# Patient Record
Sex: Male | Born: 1990
Health system: Southern US, Community
[De-identification: ages and names within clinical notes are randomized; demographics above are authoritative.]

## PROBLEM LIST (undated history)

## (undated) DIAGNOSIS — F319 Bipolar disorder, unspecified: Secondary | ICD-10-CM

## (undated) DIAGNOSIS — F32A Depression, unspecified: Secondary | ICD-10-CM

## (undated) DIAGNOSIS — F329 Major depressive disorder, single episode, unspecified: Secondary | ICD-10-CM

## (undated) HISTORY — PX: KNEE SURGERY: SHX244

## (undated) HISTORY — PX: KNEE ARTHROSCOPY: SHX127

---
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
## 2010-04-26 ENCOUNTER — Encounter: Payer: Self-pay | Admitting: Orthopedic Surgery

## 2010-06-20 LAB — BASIC METABOLIC PANEL
CO2: 27 mEq/L (ref 19–32)
Calcium: 10 mg/dL (ref 8.4–10.5)
GFR calc Af Amer: >60 mL/min (ref >60)
GFR calc non Af Amer: >60 mL/min (ref >60)
Potassium: 4 mEq/L (ref 3.5–5.1)
Sodium: 139 mEq/L (ref 135–145)

## 2010-06-20 LAB — CBC
HCT: 45.7 % (ref 39.0–52.0)
MCHC: 33.5 g/dL (ref 30.0–36.0)
Platelets: 204 10*3/uL (ref 150–400)
RDW: 12.5 % (ref 11.5–15.5)
WBC: 7.5 10*3/uL (ref 4.0–10.5)

## 2010-06-20 LAB — DIFFERENTIAL
Basophils Absolute: 0.1 10*3/uL (ref 0.0–0.1)
Basophils Relative: 1 % (ref 0–1)
Eosinophils Absolute: 0 10*3/uL (ref 0.0–0.7)
Monocytes Relative: 7 % (ref 3–12)
Neutro Abs: 5.8 10*3/uL (ref 1.7–7.7)
Neutrophils Relative %: 77 % (ref 43–77)

## 2010-06-20 LAB — RAPID URINE DRUG SCREEN, HOSP PERFORMED
Amphetamines: NOT DETECTED
Barbiturates: NOT DETECTED
Benzodiazepines: NOT DETECTED
Cocaine: NOT DETECTED
Opiates: NOT DETECTED

## 2010-06-20 LAB — LITHIUM LEVEL: Lithium Lvl: 0.36 mEq/L — ABNORMAL LOW (ref 0.80–1.40)

## 2010-06-23 LAB — DIFFERENTIAL
Basophils Absolute: 0 10*3/uL (ref 0.0–0.1)
Basophils Relative: 1 % (ref 0–1)
Basophils Relative: 1 % (ref 0–1)
Eosinophils Absolute: 0 10*3/uL (ref 0.0–0.7)
Eosinophils Absolute: 0 10*3/uL (ref 0.0–0.7)
Eosinophils Relative: 0 % (ref 0–5)
Eosinophils Relative: 1 % (ref 0–5)
Lymphocytes Relative: 25 % (ref 12–46)
Lymphs Abs: 1.2 10*3/uL (ref 0.7–4.0)
Lymphs Abs: 1.6 10*3/uL (ref 0.7–4.0)
Monocytes Absolute: 0.4 10*3/uL (ref 0.1–1.0)
Neutro Abs: 4.1 10*3/uL (ref 1.7–7.7)
Neutro Abs: 4.4 10*3/uL (ref 1.7–7.7)
Neutrophils Relative %: 75 % (ref 43–77)
Neutrophils Relative %: 64 % (ref 43–77)

## 2010-06-23 LAB — BASIC METABOLIC PANEL
BUN: 13 mg/dL (ref 6–23)
CO2: 33 mEq/L — ABNORMAL HIGH (ref 19–32)
Calcium: 9.1 mg/dL (ref 8.4–10.5)
Calcium: 9.1 mg/dL (ref 8.4–10.5)
Calcium: 9.4 mg/dL (ref 8.4–10.5)
Chloride: 105 mEq/L (ref 96–112)
Creatinine, Ser: 1.14 mg/dL (ref 0.4–1.5)
Creatinine, Ser: 1.17 mg/dL (ref 0.4–1.5)
GFR calc Af Amer: >60 mL/min (ref >60)
GFR calc Af Amer: >60 mL/min (ref >60)
GFR calc non Af Amer: >60 mL/min (ref >60)
Glucose, Bld: 86 mg/dL (ref 70–99)
Glucose, Bld: 79 mg/dL (ref 70–99)
Sodium: 139 mEq/L (ref 135–145)
Sodium: 140 mEq/L (ref 135–145)

## 2010-06-23 LAB — URINALYSIS, ROUTINE W REFLEX MICROSCOPIC
Glucose, UA: NEGATIVE mg/dL
Leukocytes, UA: NEGATIVE
Protein, ur: NEGATIVE mg/dL
Specific Gravity, Urine: 1.029 (ref 1.005–1.030)
pH: 7 (ref 5.0–8.0)

## 2010-06-23 LAB — CBC
HCT: 45 % (ref 39.0–52.0)
HCT: 46.2 % (ref 39.0–52.0)
Hemoglobin: 15.5 g/dL (ref 13.0–17.0)
Hemoglobin: 14.3 g/dL (ref 13.0–17.0)
Hemoglobin: 15.7 g/dL (ref 13.0–17.0)
MCHC: 34.5 g/dL (ref 30.0–36.0)
MCHC: 34 g/dL (ref 30.0–36.0)
MCV: 88.2 fL (ref 78.0–100.0)
MCV: 88.9 fL (ref 78.0–100.0)
Platelets: 198 10*3/uL (ref 150–400)
RBC: 4.84 MIL/uL (ref 4.22–5.81)
RDW: 13.8 % (ref 11.5–15.5)
RDW: 14.9 % (ref 11.5–15.5)
WBC: 6.3 10*3/uL (ref 4.0–10.5)

## 2010-06-23 LAB — URINE MICROSCOPIC-ADD ON

## 2010-06-23 LAB — COMPREHENSIVE METABOLIC PANEL
ALT: 39 U/L (ref 0–53)
AST: 34 U/L (ref 0–37)
Alkaline Phosphatase: 62 U/L (ref 39–117)
BUN: 7 mg/dL (ref 6–23)
CO2: 27 mEq/L (ref 19–32)
Calcium: 9.8 mg/dL (ref 8.4–10.5)
Creatinine, Ser: 1.09 mg/dL (ref 0.4–1.5)
Glucose, Bld: 93 mg/dL (ref 70–99)
Total Bilirubin: 0.9 mg/dL (ref 0.3–1.2)
Total Protein: 7.1 g/dL (ref 6.0–8.3)

## 2010-06-23 LAB — RAPID URINE DRUG SCREEN, HOSP PERFORMED
Amphetamines: NOT DETECTED
Barbiturates: NOT DETECTED
Barbiturates: NOT DETECTED
Benzodiazepines: NOT DETECTED
Cocaine: NOT DETECTED
Cocaine: NOT DETECTED
Opiates: NOT DETECTED
Tetrahydrocannabinol: NOT DETECTED

## 2010-06-23 LAB — GAMMA GT: GGT: 20 U/L (ref 7–51)

## 2010-06-23 LAB — ETHANOL: Alcohol, Ethyl (B): <5 mg/dL (ref 0–10)

## 2010-06-23 LAB — HEPATIC FUNCTION PANEL
Albumin: 3.7 g/dL (ref 3.5–5.2)
Alkaline Phosphatase: 59 U/L (ref 39–117)
Indirect Bilirubin: 1.1 mg/dL — ABNORMAL HIGH (ref 0.3–0.9)
Total Protein: 6.2 g/dL (ref 6.0–8.3)

## 2010-06-23 LAB — LITHIUM LEVEL
Lithium Lvl: 0.97 mEq/L (ref 0.80–1.40)
Lithium Lvl: <0.25 mEq/L — ABNORMAL LOW (ref 0.80–1.40)

## 2010-06-23 LAB — LIPID PANEL
Cholesterol: 125 mg/dL (ref 0–169)
HDL: 46 mg/dL (ref >34)
Triglycerides: 61 mg/dL (ref <150)

## 2010-07-10 LAB — CBC
Platelets: 160 10*3/uL (ref 150–400)
RDW: 13.2 % (ref 11.4–15.5)

## 2010-07-10 LAB — COMPREHENSIVE METABOLIC PANEL
ALT: 16 U/L (ref 0–53)
Albumin: 4.3 g/dL (ref 3.5–5.2)
Alkaline Phosphatase: 64 U/L (ref 52–171)
Potassium: 4.4 mEq/L (ref 3.5–5.1)
Sodium: 139 mEq/L (ref 135–145)
Total Protein: 6.9 g/dL (ref 6.0–8.3)

## 2010-07-10 LAB — DIFFERENTIAL
Basophils Relative: 1 % (ref 0–1)
Eosinophils Absolute: 0.1 10*3/uL (ref 0.0–1.2)
Monocytes Absolute: 0.5 10*3/uL (ref 0.2–1.2)
Monocytes Relative: 10 % (ref 3–11)

## 2010-08-17 NOTE — Op Note (Signed)
NAMEKHRISTOPHER, [name]                  ACCOUNT NO.:  [id number]   MEDICAL RECORD NO.:  [id number]          PATIENT TYPE:  AMB   LOCATION:  DAY                           FACILITY:  [facility]   PHYSICIAN:  Vickki Hearing, M.D.DATE OF BIRTH:  1990-04-06   DATE OF PROCEDURE:  01/25/2008  DATE OF DISCHARGE:                               OPERATIVE REPORT   PREOPERATIVE DIAGNOSIS:  Torn anterior cruciate ligament, left knee with  patellar dislocation.   POSTOPERATIVE DIAGNOSIS:  Torn anterior cruciate ligament, left knee  with patellar dislocation.   PROCEDURE:  Arthroscopically-assisted anterior cruciate ligament  reconstruction, left knee with Achilles tendon allograft.   SURGEON:  Vickki Hearing, MD   ASSISTED BY:  1. Margaree Mackintosh.  2. Trenton Founds.   ANESTHETIC:  General.   OPERATIVE FINDINGS:  Complete proximal tear of the ACL, medial and  lateral menisci intact, patellar retinaculum healing medially.  Lachman  was grade 2.  Pivot was grade 1.  No specimens.  Minimal blood loss.  Tourniquet was used at 300 mmHg and there were no complications.  Counts  were correct.  The patient went to PACU in good condition.   HISTORY:  A 20 year old male was injured in a football game playing for  [employer].  He was injured on December 14, 2007.  He was  hit directly from the front, hyperextended, and twisted the knee.  His  patellar dislocation was reduced on the field and he was placed in a  brace and scheduled for MRI which eventually showed torn ACL.   The patient was identified in the preop holding area and the left knee  was marked for surgery.  It was countersigned by the surgeon and  antibiotics were instituted.  He was taken to the operating room for  general anesthesia followed by exam under anesthesia.  Exam under  anesthesia findings are noted above.   After sterile prep and drape, the allograft was prepared on the back  table for an 11-mm bone block.   It was placed on a tensioning device.   The knee was injected with 30 mL of Marcaine with epinephrine.  Lateral  portal was established.  Diagnostic arthroscopy was performed.  Medial  portal was established.  Diagnostic arthroscopy was repeated with a  probe to probe the intra-articular structures.   The ACL remnant was removed.  The lateral wall was prepared to view the  over-the-top position.  The over-the-top position was visually confirmed  and palpated with the probe.  The ACL guide was set for 50 degrees and  it was placed into the joint.  The medial tunnel site was marked and the  scope and guide were removed from the joint.   Straight incision was made over the marking on the skin medially  approximately 30-degree off the midline.  Subcutaneous tissue was  divided.  Hemostasis was obtained with electrocautery.  The soft tissue  sleeve was elevated from the medial tibia.  A guidance scope were placed  back in the joint and guide pin was drilled into the joint in  the ACL  stump in its posterior footprint.  This was overreamed with an 11-mm  reamer.   The tunnel was cleaned posteriorly.  Debris was removed from the joint.  The over-the-top guide was placed in a more inferior position as  described by Lewanda Rife.   Guide pin was drilled.  It came out the lateral thigh.  This was  overreamed with an 11-mm reamer to a depth of 30 mm.  The Endobutton  drill guide was drilled over this wire and the tunnel for that measured  45 mm.  The graft was then adjusted for 20-mm plug and a 25-mm  Endobutton was applied to the graft.   The graft was then passed.  The Endobutton was flipped.  Confirmation  was performed with visual inspection through a mark placed on the graft  on the soft tissue and then also by pulling firmly on the distal portion  of the graft.   The knee was cycled several times.  Extension was checked and found to  be full with no graft impingement.   The knee was  placed in extension and an 11 x 35-mm screw was placed.  This was placed on the tibial side.  The knee was then inspected  visually with the scope and found to have good tension on the graft,  good motion.  No debris was left in the joint and it was suctioned out.   The knee was then irrigated.   Pain pump catheter was placed in the joint.  The medial tibial tunnel  site was closed with 0 Monocryl and staples.  The knee was wrapped with  sterile dressings, cryo cuff, and knee brace locked in extension.   The patient extubated and taken to recovery room in stable condition.   The patient is scheduled for discharge on ibuprofen, Vicodin, and  Phenergan.  His followup visit is scheduled for Tuesday, January 29, 2008, at 4:15.      Vickki Hearing, M.D.  Electronically Signed     SEH/MEDQ  D:  01/25/2008  T:  01/26/2008  Job:  [id number]
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
## 2011-01-03 LAB — CBC
MCHC: 34.3
MCV: 87.5
Platelets: 191
RBC: 4.46

## 2011-01-18 ENCOUNTER — Emergency Department (HOSPITAL_COMMUNITY)
Admission: EM | Admit: 2011-01-18 | Discharge: 2011-01-19 | Disposition: A | Discharge status: Home / Self Care | Payer: Medicare Other | Attending: Emergency Medicine | Admitting: Emergency Medicine

## 2011-01-18 DIAGNOSIS — F319 Bipolar disorder, unspecified: Secondary | ICD-10-CM | POA: Insufficient documentation

## 2011-01-18 DIAGNOSIS — M549 Dorsalgia, unspecified: Secondary | ICD-10-CM

## 2011-01-18 HISTORY — DX: Bipolar disorder, unspecified: F31.9
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
## 2011-01-19 MED ORDER — IBUPROFEN 800 MG PO TABS
800.0000 mg | ORAL_TABLET | Freq: Once | ORAL | Status: AC
Start: 2011-01-19 — End: 2011-01-19
  Administered 2011-01-19: 800 mg via ORAL
  Filled 2011-01-19: qty 1

## 2011-01-19 MED ORDER — IBUPROFEN 800 MG PO TABS: 800.0000 mg | ORAL_TABLET | Freq: Three times a day (TID) | ORAL | Status: AC

## 2011-01-27 ENCOUNTER — Ambulatory Visit (HOSPITAL_COMMUNITY)
Admission: RE | Admit: 2011-01-27 | Discharge: 2011-01-27 | Disposition: A | Discharge status: Home / Self Care | Payer: Medicare Other | Attending: Psychiatry | Admitting: Psychiatry

## 2011-01-27 DIAGNOSIS — F309 Manic episode, unspecified: Secondary | ICD-10-CM | POA: Insufficient documentation
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
## 2011-03-01 ENCOUNTER — Emergency Department (HOSPITAL_COMMUNITY)
Admission: EM | Admit: 2011-03-01 | Discharge: 2011-03-01 | Disposition: A | Discharge status: Home / Self Care | Payer: Medicare Other | Attending: Emergency Medicine | Admitting: Emergency Medicine

## 2011-03-01 ENCOUNTER — Encounter (HOSPITAL_COMMUNITY): Payer: Self-pay | Admitting: *Deleted

## 2011-03-01 DIAGNOSIS — F319 Bipolar disorder, unspecified: Secondary | ICD-10-CM | POA: Insufficient documentation
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
## 2011-03-01 NOTE — ED Notes (Signed)
States he feels like hurting someone, denies suicidal ideas

## 2011-03-01 NOTE — BH Assessment (Signed)
Assessment Note   [name] is an 20 y.o. male.   PT REPORTS HE LIVES WITH HIS MOTHER AND GRANDMOTHER AND GRANDMOTHER GETS ON HIS NERVES.  SHE IS ALWAYS ON HIM ABOUT SOMETHING SO HE ISOLATES IN HIS BEDROOM.  HE CAME TO THE HOSPITAL TO GET AWAY FROM HOME FOR A WHILE AND HOPEFULLY TO MEET SOME NEW PEOPLE.  HE DENIES BEING SUICIDAL OR HOMICIDAL AND HE IS NOT PSYCHOTIC.  PT REPORTS HIS MENTAL HEALTH APPOINTMENT IS TOMORROW.  HE IS ADVISED TO KEEP HIS APPOINTMENT AND CONTINUE TO BE COMPLIANT WITH HIS MEDICATIONS. PT AGREES. DISCUSSED  RECOMMENDATIONS WITH DR. Colon Branch WHO AGREES WITH DISPOSITION.         Axis I: Bipolar, Depressed Axis II: Deferred Axis III:  Past Medical History  Diagnosis Date  . Bipolar 1 disorder    Axis IV: problems related to social environment Axis V: 61-70 mild symptoms  Past Medical History:  Past Medical History  Diagnosis Date  . Bipolar 1 disorder     Past Surgical History  Procedure Date  . Knee surgery     Family History: No family history on file.  Social History:  reports that he has never smoked. He does not have any smokeless tobacco history on file. He reports that he does not drink alcohol or use illicit drugs.  Allergies: No Known Allergies  Home Medications:  No current facility-administered medications on file as of 03/01/2011.   No current outpatient prescriptions on file as of 03/01/2011.    OB/GYN Status:  No LMP for male patient.  General Assessment Data Assessment Number: 1  Living Arrangements: Parent Can pt return to current living arrangement?: Yes Admission Status: Voluntary Is patient capable of signing voluntary admission?: Yes Transfer from: Home Referral Source: Self/Family/Friend  Risk to self Suicidal Ideation: No Suicidal Intent: No Is patient at risk for suicide?: No Suicidal Plan?: No Access to Means: No What has been your use of drugs/alcohol within the last 12 months?: NA Other Self Harm Risks:  NA Triggers for Past Attempts: None known Intentional Self Injurious Behavior: None Factors that decrease suicide risk: Absense of psychosis Family Suicide History: No Recent stressful life event(s): Other (Comment) (NONE) Persecutory voices/beliefs?: No Depression: Yes Depression Symptoms: Isolating;Feeling angry/irritable Substance abuse history and/or treatment for substance abuse?: No Suicide prevention information given to non-admitted patients: Not applicable  Risk to Others Homicidal Ideation: No Thoughts of Harm to Others: No Current Homicidal Intent: No Current Homicidal Plan: No Access to Homicidal Means: No Identified Victim: NA History of harm to others?: No Assessment of Violence: None Noted Violent Behavior Description: NA Does patient have access to weapons?: No Criminal Charges Pending?: No Does patient have a court date: No  Mental Status Report Appear/Hygiene: Improved Eye Contact: Good Motor Activity: Freedom of movement Speech: Logical/coherent Level of Consciousness: Alert Mood: Depressed Affect: Appropriate to circumstance Anxiety Level: Minimal Thought Processes: Coherent;Relevant Judgement: Unimpaired Orientation: Person;Place;Time;Situation Obsessive Compulsive Thoughts/Behaviors: None  Cognitive Functioning Concentration: Normal Memory: Recent Intact;Remote Intact IQ: Average Insight: Fair Impulse Control: Good Appetite: Good Sleep: No Change Total Hours of Sleep: 8  Vegetative Symptoms: None  Prior Inpatient/Outpatient Therapy Prior Therapy: Outpatient Prior Therapy Dates: CURRENTLY Prior Therapy Facilty/Provider(s): [facility] Reason for Treatment: BIPOLAR-DEPRESSED            Values / Beliefs Cultural Requests During Hospitalization: None Spiritual Requests During Hospitalization: None        Additional Information 1:1 In Past 12 Months?: No CIRT Risk: No  Elopement Risk: No Does patient have medical clearance?:  No     Disposition:  Disposition Disposition of Patient: Referred to Patient referred to: Other (Comment) ([facility]-APPT TOMORROW)  On Site Evaluation by:   Reviewed with Physician:     Hattie Perch [name] 03/01/2011 9:16 PM

## 2011-03-02 NOTE — ED Provider Notes (Signed)
History     CSN: [id number] Arrival date & time: 03/01/2011  7:51 PM   First MD Initiated Contact with Patient 03/01/11 1957      Chief Complaint  Patient presents with  . Medical Clearance    (Consider location/radiation/quality/duration/timing/severity/associated sxs/prior treatment) HPI Comments: [name] is a 20 y.o. male  Well known to me and the ER with a history of bipolar disorder who presents to the Emergency Department complaining of having thought about hurting someone. He is not specific about any person he wants to hurt. He does not have a plan or method.He denies suicidal ideation. He says he wantsto to go to the hospital so he can meet people. He denies hallucinations. He has not attempted to hurt himself or others.   Past Medical History  Diagnosis Date  . Bipolar 1 disorder     Past Surgical History  Procedure Date  . Knee surgery     No family history on file.  History  Substance Use Topics  . Smoking status: Never Smoker   . Smokeless tobacco: Not on file  . Alcohol Use: No      Review of Systems  Psychiatric/Behavioral:       Thoughts of hurting someone  All other systems reviewed and are negative.    Allergies  Review of patient's allergies indicates no known allergies.  Home Medications   Current Outpatient Rx  Name Route Sig Dispense Refill  . LITHIUM CARBONATE 450 MG PO TBCR Oral Take 450 mg by mouth at bedtime.      Marland Kitchen MIRTAZAPINE 15 MG PO TABS Oral Take 15 mg by mouth at bedtime.      Marland Kitchen PALIPERIDONE 6 MG PO TB24 Oral Take 6 mg by mouth every morning.      . TRAZODONE HCL 100 MG PO TABS Oral Take 100 mg by mouth at bedtime.        BP 130/88  Pulse 104  Temp(Src) 98.9 F (37.2 C) (Oral)  Resp 16  Ht 5\' 7"  (1.702 m)  Wt 196 lb (88.905 kg)  BMI 30.70 kg/m2  SpO2 100%  Physical Exam  Nursing note and vitals reviewed. Constitutional: He is oriented to person, place, and time. He appears well-developed and well-nourished. No  distress.  HENT:  Head: Normocephalic and atraumatic.  Eyes: EOM are normal.  Neck: Normal range of motion.  Cardiovascular: Normal rate, normal heart sounds and intact distal pulses.   Pulmonary/Chest: Effort normal and breath sounds normal.  Abdominal: Soft. Bowel sounds are normal.  Musculoskeletal: Normal range of motion.  Neurological: He is alert and oriented to person, place, and time.  Skin: Skin is warm and dry.  Psychiatric: He has a normal mood and affect.       Patient denies suicidal ideation, is vague about hurting others. He is not psychotic    ED Course  Procedures (including critical care time)   Labs Reviewed  LAB REPORT - SCANNED   No results found.   1. Bipolar disorder    1930 Patient seen and evaluation by Behavioral Health, [facility], Ms. Hyacinth Meeker. She is familiar with the patient.She advises he is not suicidal, homicidal or psychotic. He dos not pose a danger to himself or others.He has a need to be around people. She provided him with several resources .    MDM  Patient with bipolar disorder here with vague c/o of wanting to hurt someone. On closer questioning, determined patient wants to be around more people. He is  not suicidal, homicidal or psychotic. The patient appears reasonably screened and/or stabilized for discharge and I doubt any other medical condition or other [facility] requiring further screening, evaluation, or treatment in the ED at this time prior to discharge.  MDM Reviewed: nursing note and vitals Consults: Behavioral Health.          Nicoletta Dress. Colon Branch, MD 03/02/11 1308

## 2011-03-07 ENCOUNTER — Emergency Department (HOSPITAL_COMMUNITY)
Admission: EM | Admit: 2011-03-07 | Discharge: 2011-03-07 | Disposition: A | Discharge status: Home / Self Care | Payer: Medicare Other | Attending: Emergency Medicine | Admitting: Emergency Medicine

## 2011-03-07 ENCOUNTER — Encounter (HOSPITAL_COMMUNITY): Payer: Self-pay | Admitting: *Deleted

## 2011-03-07 DIAGNOSIS — F319 Bipolar disorder, unspecified: Secondary | ICD-10-CM

## 2011-03-07 LAB — DIFFERENTIAL
Basophils Absolute: 0.1 10*3/uL (ref 0.0–0.1)
Basophils Relative: 1 % (ref 0–1)
Eosinophils Absolute: 0.1 10*3/uL (ref 0.0–0.7)
Eosinophils Relative: 1 % (ref 0–5)
Lymphocytes Relative: 25 % (ref 12–46)
Lymphs Abs: 2.1 K/uL (ref 0.7–4.0)
Monocytes Absolute: 0.9 K/uL (ref 0.1–1.0)
Monocytes Relative: 11 % (ref 3–12)
Neutro Abs: 5.3 K/uL (ref 1.7–7.7)
Neutrophils Relative %: 63 % (ref 43–77)

## 2011-03-07 LAB — RAPID URINE DRUG SCREEN, HOSP PERFORMED
Amphetamines: NOT DETECTED
Barbiturates: NOT DETECTED
Benzodiazepines: NOT DETECTED
Cocaine: NOT DETECTED
Opiates: NOT DETECTED
Tetrahydrocannabinol: NOT DETECTED

## 2011-03-07 LAB — CBC
HCT: 40.2 % (ref 39.0–52.0)
Hemoglobin: 14 g/dL (ref 13.0–17.0)
MCH: 29.8 pg (ref 26.0–34.0)
MCHC: 34.8 g/dL (ref 30.0–36.0)
MCV: 85.5 fL (ref 78.0–100.0)
Platelets: 202 10*3/uL (ref 150–400)
RBC: 4.7 MIL/uL (ref 4.22–5.81)
RDW: 12.3 % (ref 11.5–15.5)
WBC: 8.5 10*3/uL (ref 4.0–10.5)

## 2011-03-07 LAB — URINALYSIS, ROUTINE W REFLEX MICROSCOPIC
Bilirubin Urine: NEGATIVE
Glucose, UA: NEGATIVE mg/dL
Hgb urine dipstick: NEGATIVE
Leukocytes, UA: NEGATIVE
Nitrite: NEGATIVE
Protein, ur: NEGATIVE mg/dL
Specific Gravity, Urine: >1.03 — ABNORMAL HIGH (ref 1.005–1.030)
Urobilinogen, UA: 0.2 mg/dL (ref 0.0–1.0)
pH: 6 (ref 5.0–8.0)

## 2011-03-07 LAB — BASIC METABOLIC PANEL
CO2: 27 mEq/L (ref 19–32)
Calcium: 9.3 mg/dL (ref 8.4–10.5)
GFR calc Af Amer: >90 mL/min (ref >90)
GFR calc non Af Amer: 89 mL/min — ABNORMAL LOW (ref >90)
Sodium: 140 mEq/L (ref 135–145)

## 2011-03-07 LAB — BASIC METABOLIC PANEL WITH GFR
BUN: 14 mg/dL (ref 6–23)
Chloride: 106 meq/L (ref 96–112)
Creatinine, Ser: 1.17 mg/dL (ref 0.50–1.35)
Glucose, Bld: 95 mg/dL (ref 70–99)
Potassium: 3.8 meq/L (ref 3.5–5.1)

## 2011-03-07 LAB — ETHANOL: Alcohol, Ethyl (B): <11 mg/dL (ref 0–11)

## 2011-03-07 NOTE — ED Provider Notes (Signed)
History   This chart was scribed for [name]. Grant Lamas, MD by Sofie Rower. The patient was seen in room APA16A/APA16A and the patient's care was started at 6:21 PM.  CSN: [id number] Arrival date & time: 03/07/2011  5:31 PM   First MD Initiated Contact with Patient 03/07/11 1759      Chief Complaint  Patient presents with  . Medical Clearance    (Consider location/radiation/quality/duration/timing/severity/associated sxs/prior treatment) HPI [name] is a 20 y.o. male who presents to the Emergency Department for medical clearance due to depression following evaluation at [facility] facility this afternoon. Patient accompanied with IVC paperwork filed by [facility]. Per patient, he was being evaluated by counselor at [facility] today and had mentioned he was having a bad day and feeling "down" when the counselor filed IVC paperwork to have him evaluated. Notes he has had thoughts of hurting others due to his anger and depression but expresses no plan to do so. Reports previous history of similar thoughts and states thoughts are generally relieved with physical activity/exertion or lashing out at inanimate objects. Also reports having suicidal thoughts today but reports having no plan and currently denies SI. Patient was evaluated in ED on March 01, 2011 (1 week ago) for similar complaint. Patient with h/o Bipolar 1 disorder.  Past Medical History  Diagnosis Date  . Bipolar 1 disorder     Past Surgical History  Procedure Date  . Knee surgery     No family history on file.  History  Substance Use Topics  . Smoking status: Never Smoker   . Smokeless tobacco: Not on file  . Alcohol Use: No      Review of Systems 10 Systems reviewed and are negative for acute change except as noted in the HPI.  Allergies  Review of patient's allergies indicates no known allergies.  Home Medications   Current Outpatient Rx  Name Route Sig Dispense Refill  . LITHIUM CARBONATE ER 450 MG PO TBCR Oral  Take 900 mg by mouth at bedtime.     Marland Kitchen MIRTAZAPINE 15 MG PO TABS Oral Take 15 mg by mouth at bedtime.      Marland Kitchen PALIPERIDONE 6 MG PO TB24 Oral Take 6 mg by mouth every morning.      . TRAZODONE HCL 100 MG PO TABS Oral Take 100 mg by mouth at bedtime.        BP 140/88  Pulse 74  Temp(Src) 98.8 F (37.1 C) (Oral)  Resp 20  Ht 5\' 7"  (1.702 m)  Wt 196 lb (88.905 kg)  BMI 30.70 kg/m2  SpO2 99%  Physical Exam  Nursing note and vitals reviewed. Constitutional: He is oriented to person, place, and time. He appears well-developed and well-nourished. No distress.  HENT:  Head: Normocephalic and atraumatic.  Eyes: EOM are normal. Pupils are equal, round, and reactive to light. No scleral icterus.  Neck: Normal range of motion. Neck supple. No tracheal deviation present.  Cardiovascular: Normal rate.   Pulmonary/Chest: Effort normal and breath sounds normal. No respiratory distress. He has no wheezes.  Abdominal: He exhibits no distension.  Musculoskeletal: Normal range of motion. He exhibits no edema.  Neurological: He is alert and oriented to person, place, and time. No sensory deficit.  Skin: Skin is warm and dry.  Psychiatric: His behavior is normal. He is not actively hallucinating. Thought content is not delusional. He exhibits a depressed mood. He expresses no suicidal ideation. He expresses no suicidal plans and no homicidal plans.  Pt. Admits to thoughts of harming himself and other people but is non-specific. Pt. Does not have a plan.    ED Course  Procedures (including critical care time)  DIAGNOSTIC STUDIES: Oxygen Saturation is 99% on room air, normal by my interpretation.    COORDINATION OF CARE: 6:26PM- EDP at bedside consults with pt about his recent desires to hurt himself and others.  consult  Labs Reviewed  BASIC METABOLIC PANEL - Abnormal; Notable for the following:    GFR calc non Af Amer 89 (*)    All other components within normal limits  URINALYSIS,  ROUTINE W REFLEX MICROSCOPIC - Abnormal; Notable for the following:    Specific Gravity, Urine >1.030 (*)    Ketones, ur TRACE (*)    All other components within normal limits  CBC  DIFFERENTIAL  URINE RAPID DRUG SCREEN (HOSP PERFORMED)  ETHANOL   No results found.   No diagnosis found.    MDM    My exam and history is not very concerning for acute psychosis or SI or HI.  He admits he has more anger issues.  Ella with [facility] who is familiar with pt, in fact saw and facilitated discharge last week from here, reports pt is about at his baseline, he denies current SI or HI, hallucinations.  He goes to [facility] so has close follow up.  He understands to return if he feels worse.  I did review his prior ED charts.  Pt has no h/o prior suicide attempts.       I personally performed the services described in this documentation, which was scribed in my presence. The recorded information has been reviewed and considered.       [name]. Grant Lamas, MD 03/07/11 2127

## 2011-03-07 NOTE — ED Notes (Signed)
Pt states he went to the doctor today and the next thing he knew he was coming here. Staff from [name] brought med list and stated the doctor did not finish his dictation to fax [place] the pt assessment. Pt calm and cooperative. Sleepy.

## 2011-03-07 NOTE — BH Assessment (Signed)
Assessment Note   [name] is an 20 y.o. male. -PT REPORTS HE WENT TO HIS SCHEDULED APPOINTMENT WITH [facility] TODAY AND EXPLAINED HE FELT LIKE HIS MEDS WERE NOT RIGHT FOR HIM AS HE FELT SLEEPY AND CONCENTRATED ON BAD THINGS THAT HAS HAPPENED IN HIS LIFE. HE ALSO EXPRESSED SOMETIMES HE FEELS LIKE HE MAY WANT TO HURT HIMSELF AND OTHERS BUT DOES NOT ACT ON THOSE THOUGHTS AND DENIES FEELING SUICIDAL OR HOMICIDAL.  PT WAS IN THE ED 1 WEEK AGO STATING HE WANTED TO GO SOMEWHERE TO GET AWAY FROM HIS GRANDMOTHER AND MEET NEW PEOPLE. PT REPORTS NO TRANSPORTATION AND BEING BORED AT HOME AND OFTEN ISOLATES HIMSELF IN HIS ROOM. PT CONTRACTS FOR SAFETY AND TO CONTINUE TO REPORT TO [facility] WHEN SCHEDULED. DISCUSS WITH DR Oletta Lamas  WHO AGREES WITH DISPOSITION AND RESCINDED THE PETITION.        Axis I: Bipolar, Depressed Axis II: Deferred Axis III:  Past Medical History  Diagnosis Date  . Bipolar 1 disorder    Axis IV: problems with primary support group Axis V: 51-60 moderate symptoms  Past Medical History:  Past Medical History  Diagnosis Date  . Bipolar 1 disorder     Past Surgical History  Procedure Date  . Knee surgery     Family History: No family history on file.  Social History:  reports that he has never smoked. He does not have any smokeless tobacco history on file. He reports that he does not drink alcohol or use illicit drugs.  Allergies: No Known Allergies  Home Medications:  Medications Prior to Admission  Medication Sig Dispense Refill  . lithium carbonate (ESKALITH) 450 MG CR tablet Take 900 mg by mouth at bedtime.       . mirtazapine (REMERON) 15 MG tablet Take 15 mg by mouth at bedtime.        . paliperidone (INVEGA) 6 MG 24 hr tablet Take 6 mg by mouth every morning.        . traZODone (DESYREL) 100 MG tablet Take 100 mg by mouth at bedtime.         No current facility-administered medications on file as of 03/07/2011.    OB/GYN Status:  No LMP for male  patient.  General Assessment Data Assessment Number: 2  Living Arrangements: Parent Can pt return to current living arrangement?: Yes Admission Status: Involuntary Is patient capable of signing voluntary admission?: No Transfer from: Other (Comment) Referral Source: Other ([facility])  Risk to self Suicidal Ideation: No Suicidal Intent: No Is patient at risk for suicide?: No Suicidal Plan?: No Access to Means: No What has been your use of drugs/alcohol within the last 12 months?: NA Other Self Harm Risks: NA Triggers for Past Attempts: None known Intentional Self Injurious Behavior: None Factors that decrease suicide risk: Positive therapeutic relationships Family Suicide History: No Recent stressful life event(s): Conflict (Comment) (FAMILY CONFLICT) Persecutory voices/beliefs?: No Depression: Yes Depression Symptoms: Loss of interest in usual pleasures;Isolating;Feeling angry/irritable Substance abuse history and/or treatment for substance abuse?: No Suicide prevention information given to non-admitted patients: Yes  Risk to Others Homicidal Ideation: No Thoughts of Harm to Others: No Current Homicidal Intent: No Current Homicidal Plan: No Access to Homicidal Means: No Identified Victim: NA History of harm to others?: No Assessment of Violence: None Noted Violent Behavior Description: NA Does patient have access to weapons?: No Criminal Charges Pending?: No Does patient have a court date: No  Mental Status Report Appear/Hygiene: Improved Eye Contact: Good Motor Activity:  Freedom of movement Speech: Logical/coherent Level of Consciousness: Alert Mood: Irritable;Depressed Affect: Depressed Anxiety Level: Minimal Thought Processes: Coherent;Relevant Judgement: Unimpaired Orientation: Person;Place;Time;Situation Obsessive Compulsive Thoughts/Behaviors: None  Cognitive Functioning Concentration: Normal Memory: Recent Intact;Remote Intact IQ: Average Insight:  Fair Impulse Control: Good Appetite: Good Sleep: No Change Total Hours of Sleep: 8  Vegetative Symptoms: None  Prior Inpatient/Outpatient Therapy Prior Therapy: Outpatient Prior Therapy Dates: CURRENTLY Prior Therapy Facilty/Provider(s): [facility] Reason for Treatment: BIPOLAR-DEPRESSED            Values / Beliefs Cultural Requests During Hospitalization: None Spiritual Requests During Hospitalization: None        Additional Information 1:1 In Past 12 Months?: No CIRT Risk: No Elopement Risk: No Does patient have medical clearance?: Yes     Disposition:  Disposition Disposition of Patient: Referred to [facility]) Patient referred to: Other (Comment) [facility])  On Site Evaluation by:   Reviewed with Physician:     Hattie Perch [name] 03/07/2011 10:20 PM

## 2011-03-07 NOTE — Discharge Instructions (Signed)
Manic Depression (Bipolar Disorder) Bipolar disorder is also known as manic depressive illness. It is when the brain does not function properly and causes shifts in a person's moods, energy and ability to function in everyday life. These shifts are different from the normal ups and downs that everyone experiences. Instead the shifts are severe. If this goes untreated, the person's life becomes more and more disorderly. People with this disorder can be treated can lead full and productive lives. This disorder must be managed throughout life.  SYMPTOMS   Bipolar disorder causes dramatic mood swings. These mood swings go in cycles. They cycle from extreme "highs" and irritable to deep "lows" of sadness and hopelessness.   Between the extreme moods, there are usually periods of normal mood.   Along with the mood shifts, the person will have severe changes in energy and behavior. The periods of "highs" and "lows" are called episodes of mania and depression.  Signs of mania:  Lots of energy, activity and restlessness.   Extreme "high" or good mood.   Extreme irritability.   Racing thoughts and talking very fast.   Jumping from one idea to another.   Not able to focus, easily distracted.   Little need to sleep.   Grand beliefs in one's abilities and powers.   Spending sprees.   Increased sexual drive. This can result in many sexual partners.   Poor judgment.   Abuse of drugs, particularly cocaine, alcohol, and sleeping medication.   Aggressive or provocative behavior.   A lasting period of behavior that is different from usual.   Denial that anything is wrong.  *A manic episode is identified if a "high" mood happens with three or more of the other symptoms lasting most of the day, nearly everyday for a week or longer. If the mood is more irritable in nature, four additional symptoms must be present. Signs of depression:  Lasting feelings of sadness, anxiety, or empty mood.    Feelings of hopelessness with negative thoughts.   Feelings of guilt, worthlessness, or helplessness.   Loss of interest or pleasure in activities once enjoyed, including sex.   Feelings of fatigue or having less energy.   Trouble focusing, making decisions, remembering.   Feeling restless or irritable.   Sleeping too little or too much.   Change in eating with possible weight gain or loss.   Feeling ongoing pain that is not caused by physical illness or injury.   Thoughts of death or suicide or suicide attempts.  *A depressive episode is identified as having five or more of the above symptoms that last most of the day, nearly everyday for two weeks or longer. CAUSES   Research shows that there is no single cause for the disorder. Many factors act together to produce the illness.   This can be passed down from family (hereditary).   Environment may play a part.  TREATMENT   Long-term treatment is strongly recommended because bipolar disorder is a repeated illness. This disorder is better controlled if treatment is ongoing than if it is off and on.   A combination of medication and talk therapy is best for managing the disorder over time.   Medication.   Medication can be prescribed by a doctor that is an expert in treating mental disorders (psychiatrists). Medications known as "mood stabilizers" are usually prescribed to help control the illness. Other medications can be added when needed. These medicines usually treat episodes of mania or depression that break through despite the   mood stabilizer.   Talk Therapy.   Along with medication, some forms of talk therapy are helpful in providing support, education and guidance to people with the illness and their families. Studies show that this type of treatment increases mood stability, decreases need for hospitalization and improves how they function society.   Electroconvulsive Therapy (ECT).   In extreme situations where  the above treatments do not work or work too slowly to relieve severe symptoms, ECT may be considered.  Document Released: 06/27/2000 Document Revised: 12/01/2010 Document Reviewed: 02/16/2007 [employer] Patient Information 2012 [employer]. 

## 2011-03-07 NOTE — ED Notes (Signed)
Pt sent by [employer] by [facility] for commitment. Pt states he told them he wanted to hurt himself due to medications. Pt currently denies SI at this time. [facility] to fax over papers per officer.

## 2011-03-16 ENCOUNTER — Encounter (HOSPITAL_COMMUNITY): Payer: Self-pay | Admitting: *Deleted

## 2011-03-16 ENCOUNTER — Emergency Department (HOSPITAL_COMMUNITY)
Admission: EM | Admit: 2011-03-16 | Discharge: 2011-03-21 | Disposition: A | Discharge status: Other Acute Inpatient Hospital | Payer: Medicare Other | Attending: Emergency Medicine | Admitting: Emergency Medicine

## 2011-03-16 DIAGNOSIS — Z87891 Personal history of nicotine dependence: Secondary | ICD-10-CM | POA: Insufficient documentation

## 2011-03-16 DIAGNOSIS — F319 Bipolar disorder, unspecified: Secondary | ICD-10-CM

## 2011-03-16 LAB — BASIC METABOLIC PANEL
BUN: 8 mg/dL (ref 6–23)
Chloride: 102 mEq/L (ref 96–112)
GFR calc Af Amer: >90 mL/min (ref >90)
Glucose, Bld: 98 mg/dL (ref 70–99)
Potassium: 4.2 mEq/L (ref 3.5–5.1)

## 2011-03-16 LAB — RAPID URINE DRUG SCREEN, HOSP PERFORMED: Barbiturates: NOT DETECTED

## 2011-03-16 LAB — CBC
HCT: 43.8 % (ref 39.0–52.0)
Hemoglobin: 14.2 g/dL (ref 13.0–17.0)
RDW: 12.8 % (ref 11.5–15.5)
WBC: 5.1 10*3/uL (ref 4.0–10.5)

## 2011-03-16 MED ORDER — LORAZEPAM 1 MG PO TABS
1.0000 mg | ORAL_TABLET | ORAL | Status: DC | PRN
  Administered 2011-03-17 – 2011-03-20 (×4): 1 mg via ORAL
  Filled 2011-03-16 (×2): qty 1

## 2011-03-16 MED ORDER — ACETAMINOPHEN 325 MG PO TABS: 650.0000 mg | ORAL_TABLET | ORAL | Status: DC | PRN

## 2011-03-16 MED ORDER — MIRTAZAPINE 15 MG PO TABS
15.0000 mg | ORAL_TABLET | Freq: Every day | ORAL | Status: DC
  Administered 2011-03-17 – 2011-03-20 (×4): 15 mg via ORAL
  Filled 2011-03-16 (×3): qty 1

## 2011-03-16 MED ORDER — ONDANSETRON HCL 4 MG PO TABS: 4.0000 mg | ORAL_TABLET | Freq: Three times a day (TID) | ORAL | Status: DC | PRN

## 2011-03-16 MED ORDER — LORAZEPAM 2 MG/ML IJ SOLN
2.0000 mg | Freq: Once | INTRAMUSCULAR | Status: AC
  Administered 2011-03-16: 2 mg via INTRAVENOUS

## 2011-03-16 MED ORDER — TRAZODONE HCL 50 MG PO TABS
100.0000 mg | ORAL_TABLET | Freq: Every day | ORAL | Status: DC
  Administered 2011-03-17 – 2011-03-20 (×4): 100 mg via ORAL
  Filled 2011-03-16 (×3): qty 2

## 2011-03-16 MED ORDER — HALOPERIDOL LACTATE 5 MG/ML IJ SOLN
5.0000 mg | Freq: Once | INTRAMUSCULAR | Status: AC
  Administered 2011-03-16: 5 mg via INTRAMUSCULAR

## 2011-03-16 MED ORDER — IBUPROFEN 400 MG PO TABS: 600.0000 mg | ORAL_TABLET | Freq: Three times a day (TID) | ORAL | Status: DC | PRN

## 2011-03-16 MED ORDER — LORAZEPAM 2 MG/ML IJ SOLN
INTRAMUSCULAR | Status: AC
  Filled 2011-03-16: qty 1

## 2011-03-16 MED ORDER — HALOPERIDOL LACTATE 5 MG/ML IJ SOLN
INTRAMUSCULAR | Status: AC
  Filled 2011-03-16: qty 1

## 2011-03-16 MED ORDER — PALIPERIDONE ER 6 MG PO TB24
6.0000 mg | ORAL_TABLET | ORAL | Status: DC
Start: 2011-03-17 — End: 2011-03-21
  Administered 2011-03-17 – 2011-03-20 (×4): 6 mg via ORAL
  Filled 2011-03-16 (×4): qty 1

## 2011-03-16 MED ORDER — LITHIUM CARBONATE ER 450 MG PO TBCR
900.0000 mg | EXTENDED_RELEASE_TABLET | Freq: Every day | ORAL | Status: DC
  Administered 2011-03-17 – 2011-03-20 (×4): 900 mg via ORAL
  Filled 2011-03-16 (×3): qty 2

## 2011-03-16 NOTE — ED Provider Notes (Addendum)
This chart was scribed for Joya Gaskins, MD by Wallis Mart. The patient was seen in room APA03/APA03 and the patient's care was started at 12:38 PM.   CSN: [id number] Arrival date & time: 03/16/2011 11:05 AM   First MD Initiated Contact with Patient 03/16/11 1147      Chief Complaint  Patient presents with  . Medical Clearance     The history is provided by the patient. The history is limited by the condition of the patient.   Level 5 caveat due to altered mental status.    [name] is a 20 y.o. male who presents to the Emergency Department due to altered mental status. Per [facility], pt is not taking his meds,hasn't slept in 3 days, has been physically and verbally aggressive both physically and verbally, and is having delusions. Pt has hx of Bipolar 1 Disorder. Pt states that he planned to have a baby.  Pt states that talking to himslef makes the baby mad. Pt denies any pain.   He reports he might hurt himself  Past Medical History  Diagnosis Date  . Bipolar 1 disorder     Past Surgical History  Procedure Date  . Knee surgery     History reviewed. No pertinent family history.  History  Substance Use Topics  . Smoking status: Former Games developer  . Smokeless tobacco: Not on file  . Alcohol Use: No      Review of Systems  Unable to perform ROS: Psychiatric disorder    Allergies  Review of patient's allergies indicates no known allergies.  Home Medications   Current Outpatient Rx  Name Route Sig Dispense Refill  . LITHIUM CARBONATE ER 450 MG PO TBCR Oral Take 900 mg by mouth at bedtime.     Marland Kitchen MIRTAZAPINE 15 MG PO TABS Oral Take 15 mg by mouth at bedtime.      Marland Kitchen PALIPERIDONE 6 MG PO TB24 Oral Take 6 mg by mouth every morning.      . TRAZODONE HCL 100 MG PO TABS Oral Take 100 mg by mouth at bedtime.        BP 147/85  Pulse 88  Temp(Src) 98.3 F (36.8 C) (Oral)  Resp 18  Ht 5\' 7"  (1.702 m)  Wt 195 lb (88.451 kg)  BMI 30.54 kg/m2  SpO2  99%  Physical Exam CONSTITUTIONAL: Well developed/well nourished HEAD AND FACE: Normocephalic/atraumatic EYES: EOMI/PERRL ENMT: Mucous membranes moist NECK: supple no meningeal signs SPINE:entire spine nontender CV: S1/S2 noted, no murmurs/rubs/gallops noted LUNGS: Lungs are clear to auscultation bilaterally, no apparent distress ABDOMEN: soft, nontender, no rebound or guarding NEURO: Pt is awake/alert, moves all extremitiesx4 EXTREMITIES: pulses normal, full ROM SKIN: warm, color normal PSYCH: anxious, he reports he will have a baby soon but he wants to keep it in ED Course  Procedures  DIAGNOSTIC STUDIES: Oxygen Saturation is 99% on room air, normal by my interpretation.    COORDINATION OF CARE:     Labs Reviewed  CBC  BASIC METABOLIC PANEL  ETHANOL  URINE RAPID DRUG SCREEN (HOSP PERFORMED)    1:21 PM Pt stable, h/o bipolar, now having psychotic features, sent from [facility] D/w [facility] will see patient   MDM  Nursing notes reviewed and considered in documentation All labs/vitals reviewed and considered Previous records reviewed and considered    I personally performed the services described in this documentation, which was scribed in my presence. The recorded information has been reviewed and considered.    Joya Gaskins,  MD 03/16/11 1322  3:37 PM Pt became more agitated, acting out Haldol/ativan ordered Pt more calm at this time Police at bedside Labs reviewed D/w [facility], IVC paperwork completed  CRITICAL CARE Performed by: Joya Gaskins   Total critical care time: 35  Critical care time was exclusive of separately billable procedures and treating other patients.  Critical care was necessary to treat or prevent imminent or life-threatening deterioration.  Critical care was time spent personally by me on the following activities: development of treatment plan with patient and/or surrogate as well as nursing, discussions with consultants,  evaluation of patient's response to treatment, examination of patient, obtaining history from patient or surrogate, ordering and performing treatments and interventions, ordering and review of laboratory studies, ordering and review of radiographic studies, pulse oximetry and re-evaluation of patient's condition.   Joya Gaskins, MD 03/16/11 1537

## 2011-03-16 NOTE — ED Notes (Signed)
Pt changed into blue scrubs.  Security paged to wand pt.  Emergency planning/management officer at bedside.  NAD at this time.

## 2011-03-16 NOTE — ED Notes (Signed)
Per [facility] pt is not taking his meds, he hasn't slept in 3 days. He has been aggressive both physically and verbally.he is unable to make sound judgment due to his mental state. He is unsafe being in public and by his self.he is also having delusions of grandiose.   Pt states he feels mad. Pt denies si and states he has had thoughts of hi. Pt was brought to ed by 2 law enforcement officers in hand cuffs.

## 2011-03-16 NOTE — ED Notes (Signed)
Pt became violent, throwing a food tray, threw food.  Wandra Feinstein, SW at bedside prior to patient becoming violent.  Wandra Feinstein, SW was completing his assessment when patient stated his voices were becoming overwhelming and had his outburst.  [employer] called back to bedside.  Dr. Bebe Shaggy informed of pt's behaviors - meds ordered and given.

## 2011-03-16 NOTE — BH Assessment (Signed)
Assessment Note   [name] is an 20 y.o. male. The patient is a resident of a local family care home. He has not been taking his medications for an undisclosed period. He has not slept in 3 days. He is having auditory hallucinations with commands. He appears to be delusional, talking about not being in the family. He is very agitated and became more agitated during the interview. Dr Bebe Shaggy order  IM medications for the patients agitation.  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 Bipolar 1 disorder    Axis IV: other psychosocial or environmental problems Axis V: 11-20 some danger of hurting self or others possible OR occasionally fails to maintain minimal personal hygiene OR gross impairment in communication  Past Medical History:  Past Medical History  Diagnosis Date  . Bipolar 1 disorder     Past Surgical History  Procedure Date  . Knee surgery     Family History: History reviewed. No pertinent family history.  Social History:  reports that he has quit smoking. He does not have any smokeless tobacco history on file. He reports that he does not drink alcohol or use illicit drugs.  Additional Social History:    Allergies: No Known Allergies  Home Medications:  Medications Prior to Admission  Medication Dose Route Frequency Provider Last Rate Last Dose  . acetaminophen (TYLENOL) tablet 650 mg  650 mg Oral Q4H PRN Joya Gaskins, MD      . haloperidol lactate (HALDOL) injection 5 mg  5 mg Intramuscular Once Joya Gaskins, MD   5 mg at 03/16/11 1508  . ibuprofen (ADVIL,MOTRIN) tablet 600 mg  600 mg Oral Q8H PRN Joya Gaskins, MD      . lithium carbonate (ESKALITH) CR tablet 900 mg  900 mg Oral QHS Joya Gaskins, MD      . LORazepam (ATIVAN) injection 2 mg  2 mg Intravenous Once Joya Gaskins, MD   2 mg at 03/16/11 1508  . LORazepam (ATIVAN) tablet 1 mg  1 mg Oral Q4H PRN  Joya Gaskins, MD      . mirtazapine (REMERON) tablet 15 mg  15 mg Oral QHS Joya Gaskins, MD      . ondansetron [facility]) tablet 4 mg  4 mg Oral Q8H PRN Joya Gaskins, MD      . paliperidone (INVEGA) 24 hr tablet 6 mg  6 mg Oral Q0700 Joya Gaskins, MD      . traZODone (DESYREL) tablet 100 mg  100 mg Oral QHS Joya Gaskins, MD       Medications Prior to Admission  Medication Sig Dispense Refill  . lithium carbonate (ESKALITH) 450 MG CR tablet Take 900 mg by mouth at bedtime.       . mirtazapine (REMERON) 15 MG tablet Take 15 mg by mouth at bedtime.        . paliperidone (INVEGA) 6 MG 24 hr tablet Take 6 mg by mouth every morning.        . traZODone (DESYREL) 100 MG tablet Take 100 mg by mouth at bedtime.          OB/GYN Status:  No LMP for male patient.  General Assessment Data Assessment Number: 2  Living Arrangements: Other (Comment) (family care home) Can pt return to current living arrangement?: Yes Admission Status: Involuntary Is patient capable of signing voluntary admission?: No Transfer  from: Acute Hospital Referral Source: MD  Education Status Is patient currently in school?: No Contact person: [name]  Risk to self Suicidal Ideation: No Suicidal Intent: No Is patient at risk for suicide?: Yes Suicidal Plan?: No Access to Means: No What has been your use of drugs/alcohol within the last 12 months?: na Previous Attempts/Gestures: No How many times?: 0  Other Self Harm Risks: na Triggers for Past Attempts: None known Intentional Self Injurious Behavior: None Factors that decrease suicide risk: Positive therapeutic relationships Family Suicide History: No Recent stressful life event(s): Other (Comment) (not comfortable in family care home) Persecutory voices/beliefs?: No Depression: No Substance abuse history and/or treatment for substance abuse?: No Suicide prevention information given to non-admitted patients: Not applicable  Risk to  Others Homicidal Ideation: No Current Homicidal Intent: No Current Homicidal Plan: No Access to Homicidal Means: No Identified Victim: na History of harm to others?: No Assessment of Violence: On admission Violent Behavior Description: becoming agitated, talking to self, started to throw things, given medication Does patient have access to weapons?: No Criminal Charges Pending?: No Does patient have a court date: No  Psychosis Hallucinations: Auditory;Visual;With command (responding to internal stimuli) Delusions: Grandiose;Persecutory (talks about being or not being part of the family)  Mental Status Report Appear/Hygiene: Other (Comment) (neat) Eye Contact: Poor Motor Activity: Agitation;Restlessness Speech: Argumentative;Rapid;Tangential;Word salad Level of Consciousness: Restless;Alert;Irritable Mood: Angry;Irritable;Preoccupied Affect: Angry;Anxious;Depressed;Irritable;Preoccupied Anxiety Level: Moderate Thought Processes: Circumstantial;Tangential Judgement: Impaired Orientation: Place Obsessive Compulsive Thoughts/Behaviors: Moderate  Cognitive Functioning Concentration: Decreased Memory: Recent Impaired;Remote Impaired IQ: Average Insight: Poor Impulse Control: Poor Appetite: Good Weight Loss: 0  Weight Gain: 0  Sleep: Decreased Total Hours of Sleep: 0  (not slept in 3 days) Vegetative Symptoms: None Vegetative Symptoms: None  Prior Inpatient Therapy Prior Inpatient Therapy: Yes Prior Therapy Dates:  unknown  Prior Outpatient Therapy Prior Outpatient Therapy: Yes Prior Therapy Dates: 2012 Prior Therapy Facilty/Provider(s): [facility] Reason for Treatment: medications            Values / Beliefs Cultural Requests During Hospitalization: None Spiritual Requests During Hospitalization: None        Additional Information 1:1 In Past 12 Months?: No CIRT Risk: No Elopement Risk: No Does patient have medical clearance?: Yes      Disposition:  Disposition Disposition of Patient: Inpatient treatment program Type of inpatient treatment program: Adult Patient referred to:  (pending [facility]) Patient has been a patient at [facility] in the past and was referred there at this time. He is pending acceptance. Dr. Bebe Shaggy is in agreement with this disposition. On Site Evaluation by:   Reviewed with Physician:     Jearld Pies 03/16/2011 3:18 PM

## 2011-03-17 MED ORDER — ZIPRASIDONE MESYLATE 20 MG IM SOLR
INTRAMUSCULAR | Status: AC
  Administered 2011-03-17: 10 mg via INTRAMUSCULAR
  Filled 2011-03-17: qty 20

## 2011-03-17 MED ORDER — ZIPRASIDONE MESYLATE 20 MG IM SOLR
10.0000 mg | Freq: Once | INTRAMUSCULAR | Status: AC
  Administered 2011-03-17: 10 mg via INTRAMUSCULAR

## 2011-03-17 NOTE — ED Notes (Signed)
Pt sleeping soundly, arouses lightly to name called but is very groggy.   Pt 2200 meds held  D/t same.

## 2011-03-17 NOTE — BH Assessment (Signed)
Assessment Note   [name] is an 20 y.o. male. The patient has been in the ED overnight. He was brought in on IVC. His relative petitioned and the patient  Was picked up at [facility] where he had an appointment. He was agitated and threatening at that time.After arriving at the ED he was left alone by law enforcement while he awaited medical clearance. When interviewed by TMurphyMSW LCSW, the patient was responding to internal stimuli. He was very tangential and circumstantial. He was medicated for his agitation. He hax been calm and cooperative since. Today he has become tearful and again is agitated ,but he is cooperating with the nurse.Axis I: Bipolar, Manic Axis II: Deferred Axis III:  Past Medical History  Diagnosis Date  . Bipolar 1 disorder    Axis IV: housing problems, other psychosocial or environmental problems, problems related to social environment and problems with primary support group Axis V: 11-20 some danger of hurting self or others possible OR occasionally fails to maintain minimal personal hygiene OR gross impairment in communication  Past Medical History:  Past Medical History  Diagnosis Date  . Bipolar 1 disorder     Past Surgical History  Procedure Date  . Knee surgery     Family History: History reviewed. No pertinent family history.  Social History:  reports that he has quit smoking. He does not have any smokeless tobacco history on file. He reports that he does not drink alcohol or use illicit drugs.  Additional Social History:    Allergies: No Known Allergies  Home Medications:  Medications Prior to Admission  Medication Dose Route Frequency Provider Last Rate Last Dose  . acetaminophen (TYLENOL) tablet 650 mg  650 mg Oral Q4H PRN Joya Gaskins, MD      . haloperidol lactate (HALDOL) injection 5 mg  5 mg Intramuscular Once Joya Gaskins, MD   5 mg at 03/16/11 1508  . ibuprofen (ADVIL,MOTRIN) tablet 600 mg  600 mg Oral Q8H PRN Joya Gaskins, MD      . lithium carbonate (ESKALITH) CR tablet 900 mg  900 mg Oral QHS Joya Gaskins, MD      . LORazepam (ATIVAN) injection 2 mg  2 mg Intravenous Once Joya Gaskins, MD   2 mg at 03/16/11 1508  . LORazepam (ATIVAN) tablet 1 mg  1 mg Oral Q4H PRN Joya Gaskins, MD      . mirtazapine (REMERON) tablet 15 mg  15 mg Oral QHS Joya Gaskins, MD      . ondansetron [facility]) tablet 4 mg  4 mg Oral Q8H PRN Joya Gaskins, MD      . paliperidone (INVEGA) 24 hr tablet 6 mg  6 mg Oral Q0700 Joya Gaskins, MD   6 mg at 03/17/11 0909  . traZODone (DESYREL) tablet 100 mg  100 mg Oral QHS Joya Gaskins, MD      . ziprasidone (GEODON) injection 10 mg  10 mg Intramuscular Once Benny Lennert, MD   10 mg at 03/17/11 1406   Medications Prior to Admission  Medication Sig Dispense Refill  . lithium carbonate (ESKALITH) 450 MG CR tablet Take 900 mg by mouth at bedtime.       . mirtazapine (REMERON) 15 MG tablet Take 15 mg by mouth at bedtime.        . paliperidone (INVEGA) 6 MG 24 hr tablet Take 6 mg by mouth every morning.        Marland Kitchen  traZODone (DESYREL) 100 MG tablet Take 100 mg by mouth at bedtime.          OB/GYN Status:  No LMP for male patient.  General Assessment Data Assessment Number: 3  Living Arrangements: Alone;Relatives;Non-Relatives (patient is unclear about living arrangements, says he moves ) Can pt return to current living arrangement?: Yes Admission Status: Involuntary Is patient capable of signing voluntary admission?: No Transfer from: Acute Hospital Referral Source: MD  Education Status Is patient currently in school?: No Contact person: [name]  Risk to self Suicidal Ideation: Yes-Currently Present (talking about not wanting to be here) Suicidal Intent: No Is patient at risk for suicide?: Yes Suicidal Plan?: No Access to Means: No What has been your use of drugs/alcohol within the last 12 months?: na Previous Attempts/Gestures: No How  many times?: 0  Other Self Harm Risks: na Triggers for Past Attempts: None known Intentional Self Injurious Behavior: None Factors that decrease suicide risk: Positive therapeutic relationships Family Suicide History: No Recent stressful life event(s): Conflict (Comment);Turmoil (Comment) (poor relationships with family and friends) Persecutory voices/beliefs?: No Depression: Yes Depression Symptoms: Tearfulness;Loss of interest in usual pleasures;Feeling worthless/self pity;Feeling angry/irritable Substance abuse history and/or treatment for substance abuse?: No Suicide prevention information given to non-admitted patients: Not applicable  Risk to Others Homicidal Ideation: No Thoughts of Harm to Others: No Current Homicidal Intent: No Current Homicidal Plan: No Access to Homicidal Means: No Identified Victim: na History of harm to others?: No Assessment of Violence: On admission Violent Behavior Description: patient agitated but not violent, calm and cooperative since 1st episode on admission Does patient have access to weapons?: No Criminal Charges Pending?: No Does patient have a court date: No  Psychosis Hallucinations: Auditory (talking with people who are no0t there) Delusions: Grandiose (some religious conotations, talks about being a member of th)  Mental Status Report Appear/Hygiene: Improved Eye Contact: Poor Motor Activity: Restlessness Speech: Soft;Logical/coherent Level of Consciousness: Restless;Alert;Irritable;Crying Mood: Depressed;Sad Affect: Anxious;Depressed;Irritable;Sad Anxiety Level: Moderate Thought Processes: Coherent;Circumstantial Judgement: Impaired Orientation: Place;Time Obsessive Compulsive Thoughts/Behaviors: Minimal  Cognitive Functioning Concentration: Decreased Memory: Recent Impaired;Remote Impaired IQ: Average Insight: Poor Impulse Control: Poor Appetite: Good Weight Loss: 0  Weight Gain: 0  Sleep: Decreased Total Hours of  Sleep: 5  Vegetative Symptoms: None Vegetative Symptoms: None  Prior Inpatient Therapy Prior Inpatient Therapy: Yes Prior Therapy Dates:  unknown Prior Therapy Facilty/Provider(s): [facility] Reason for Treatment: BIPOLAR-DEPRESSED  Prior Outpatient Therapy Prior Outpatient Therapy: Yes Prior Therapy Dates: 2012 Prior Therapy Facilty/Provider(s): [facility] Reason for Treatment: medications            Values / Beliefs Cultural Requests During Hospitalization: None Spiritual Requests During Hospitalization: None        Additional Information 1:1 In Past 12 Months?: No CIRT Risk: No Elopement Risk: No Does patient have medical clearance?: Yes     Disposition:  Disposition Disposition of Patient: Inpatient treatment program Type of inpatient treatment program: Adult Patient referred to: Other (Comment) ([facility], [facility]) Patient was declined by both [facility] and [facility] because of violence. The patient has not been violent and is being referred again as we have spoken to both facilities and  they agreed to review a new assessment and EDd information. On Site Evaluation by:   Reviewed with Physician:     Jearld Pies 03/17/2011 2:18 PM

## 2011-03-17 NOTE — ED Notes (Signed)
[facility] declined pt.  Request sent to [facility] by Frances Maywood, ACT team.

## 2011-03-17 NOTE — ED Notes (Signed)
Pt has been sleeping most of morning. Woke up for meds but went back to sleep. Sitter with pt. [name], with ACT here to try and find placement for pt

## 2011-03-17 NOTE — ED Notes (Signed)
No report of further outburst by pt since having to be medicated yesterday. Pt is currently sleeping. NAD

## 2011-03-17 NOTE — ED Notes (Signed)
Pt remains calm and cooperative 

## 2011-03-17 NOTE — ED Notes (Signed)
Pt ambulated to restroom. NAD. Pt remains clam and cooperative. Sitter has remained at bedside.

## 2011-03-17 NOTE — ED Notes (Signed)
After being evaluated by Frances Maywood, ACT, pt began crying, became very tense, and was rocking back and forth. EMD made aware and pt medicated to prevent further agitation. Pt had stated that he wanted to leave so he could get back to doing what he needed to do. Explained to pt that he would be placed somewhere as soon as possible and he would be released after being treated at a mental health facility. Pt remained calm and allowed IM injection without incident. Sitter remains at bedside.

## 2011-03-18 NOTE — ED Notes (Signed)
Pt resting in bed.  Pt took PO meds and refused breakfast.  Breakfast tray left at bedside.

## 2011-03-18 NOTE — ED Notes (Signed)
Pt resting in bed with sitter present. In no apparent distress. Breathing even and unlabored, even rise and fall of chest noted. Will continue to monitor.

## 2011-03-18 NOTE — BH Assessment (Signed)
Assessment Note   [name] is an 20 y.o. male. PT WAS BROUGHT IN VIA PETITION ON 12/13 DUE TO ALTERED MENTAL STATUS, AGITATION & UNCOOPERATIVE BEHAVIOR. DURING REASSESSMENT WITH CLINICIAN, PT SEEM CALM, COOPERATIVE. PT CURRENTLY DENIES SI, HI OR AV & EXPRESSED THAT HE FELT BETTER TODAY. EDP WANT PT PLACE & STABILIZED. PT HAS BEEN DENIED AT [facility] & IS STILL PENDING [facility] ([facility] HALL BED). PT WILL REMAIN IN ED UNTIL PLACEMENT IS FOUND.  Axis I: Bipolar, mixed, Mood Disorder NOS and Schizoaffective Disorder Axis II: Deferred Axis III:  Past Medical History  Diagnosis Date  . Bipolar 1 disorder    Axis IV: housing problems, other psychosocial or environmental problems, problems related to social environment and problems with primary support group Axis V: 21-30 behavior considerably influenced by delusions or hallucinations OR serious impairment in judgment, communication OR inability to function in almost all areas  Past Medical History:  Past Medical History  Diagnosis Date  . Bipolar 1 disorder     Past Surgical History  Procedure Date  . Knee surgery     Family History: History reviewed. No pertinent family history.  Social History:  reports that he has quit smoking. He does not have any smokeless tobacco history on file. He reports that he does not drink alcohol or use illicit drugs.  Additional Social History:    Allergies: No Known Allergies  Home Medications:  Medications Prior to Admission  Medication Dose Route Frequency Provider Last Rate Last Dose  . acetaminophen (TYLENOL) tablet 650 mg  650 mg Oral Q4H PRN Joya Gaskins, MD      . haloperidol lactate (HALDOL) injection 5 mg  5 mg Intramuscular Once Joya Gaskins, MD   5 mg at 03/16/11 1508  . ibuprofen (ADVIL,MOTRIN) tablet 600 mg  600 mg Oral Q8H PRN Joya Gaskins, MD      . lithium carbonate (ESKALITH) CR tablet 900 mg  900 mg Oral QHS Joya Gaskins, MD   900 mg at 03/17/11  2228  . LORazepam (ATIVAN) injection 2 mg  2 mg Intravenous Once Joya Gaskins, MD   2 mg at 03/16/11 1508  . LORazepam (ATIVAN) tablet 1 mg  1 mg Oral Q4H PRN Joya Gaskins, MD   1 mg at 03/17/11 2227  . mirtazapine (REMERON) tablet 15 mg  15 mg Oral QHS Joya Gaskins, MD   15 mg at 03/17/11 2227  . ondansetron (ZOFRAN) tablet 4 mg  4 mg Oral Q8H PRN Joya Gaskins, MD      . paliperidone (INVEGA) 24 hr tablet 6 mg  6 mg Oral Q0700 Joya Gaskins, MD   6 mg at 03/18/11 0805  . traZODone (DESYREL) tablet 100 mg  100 mg Oral QHS Joya Gaskins, MD   100 mg at 03/17/11 2226  . ziprasidone (GEODON) injection 10 mg  10 mg Intramuscular Once Benny Lennert, MD   10 mg at 03/17/11 1406   Medications Prior to Admission  Medication Sig Dispense Refill  . lithium carbonate (ESKALITH) 450 MG CR tablet Take 900 mg by mouth at bedtime.       . mirtazapine (REMERON) 15 MG tablet Take 15 mg by mouth at bedtime.        . paliperidone (INVEGA) 6 MG 24 hr tablet Take 6 mg by mouth every morning.        . traZODone (DESYREL) 100 MG tablet Take 100 mg  by mouth at bedtime.          OB/GYN Status:  No LMP for male patient.  General Assessment Data Living Arrangements: Alone;Relatives;Non-Relatives (patient is unclear about living arrangements, says he moves ) Can pt return to current living arrangement?: Yes Admission Status: Involuntary Is patient capable of signing voluntary admission?: No Transfer from: Acute Hospital Referral Source: MD  Education Status Is patient currently in school?: No Contact person: [name]  Risk to self Suicidal Ideation: Yes-Currently Present (talking about not wanting to be here) Suicidal Intent: No Is patient at risk for suicide?: Yes Suicidal Plan?: No Access to Means: No What has been your use of drugs/alcohol within the last 12 months?: na Previous Attempts/Gestures: No How many times?: 0  Other Self Harm Risks: na Triggers for Past  Attempts: None known Intentional Self Injurious Behavior: None Factors that decrease suicide risk: Positive therapeutic relationships Family Suicide History: No Recent stressful life event(s): Conflict (Comment);Turmoil (Comment) (poor relationships with family and friends) Persecutory voices/beliefs?: No Depression: Yes Depression Symptoms: Tearfulness;Loss of interest in usual pleasures;Feeling worthless/self pity;Feeling angry/irritable Substance abuse history and/or treatment for substance abuse?: No Suicide prevention information given to non-admitted patients: Not applicable  Risk to Others Homicidal Ideation: No Thoughts of Harm to Others: No Current Homicidal Intent: No Current Homicidal Plan: No Access to Homicidal Means: No Identified Victim: na History of harm to others?: No Assessment of Violence: On admission Violent Behavior Description: patient agitated but not violent, calm and cooperative since 1st episode on admission Does patient have access to weapons?: No Criminal Charges Pending?: No Does patient have a court date: No  Psychosis Hallucinations: Auditory (talking with people who are no0t there) Delusions: Grandiose (some religious conotations, talks about being a member of th)  Mental Status Report Appear/Hygiene: Improved Eye Contact: Poor Motor Activity: Restlessness Speech: Soft;Logical/coherent Level of Consciousness: Restless;Alert;Irritable;Crying Mood: Depressed;Sad Affect: Anxious;Depressed;Irritable;Sad Anxiety Level: Moderate Thought Processes: Coherent;Circumstantial Judgement: Impaired Orientation: Place;Time Obsessive Compulsive Thoughts/Behaviors: Minimal  Cognitive Functioning Concentration: Decreased Memory: Recent Impaired;Remote Impaired IQ: Average Insight: Poor Impulse Control: Poor Appetite: Good Weight Loss: 0  Weight Gain: 0  Sleep: Decreased Total Hours of Sleep: 5  Vegetative Symptoms: None Vegetative Symptoms:  None  Prior Inpatient Therapy Prior Inpatient Therapy: Yes Prior Therapy Dates:  unknown Prior Therapy Facilty/Provider(s): [facility] Reason for Treatment: BIPOLAR-DEPRESSED  Prior Outpatient Therapy Prior Outpatient Therapy: Yes Prior Therapy Dates: 2012 Prior Therapy Facilty/Provider(s): [facility] Reason for Treatment: medications            Values / Beliefs Cultural Requests During Hospitalization: None Spiritual Requests During Hospitalization: None        Additional Information 1:1 In Past 12 Months?: No CIRT Risk: No Elopement Risk: No Does patient have medical clearance?: Yes     Disposition:  Disposition Disposition of Patient: Inpatient treatment program Type of inpatient treatment program: Adult Patient referred to: Other (Comment) ([facility], [facility])  On Site Evaluation by:   Reviewed with Physician:     Waldron Session 03/18/2011 11:54 AM

## 2011-03-18 NOTE — ED Notes (Signed)
Pt calm, cooperative, nad noted.  Sitter at bedside.  Pt watching tv.

## 2011-03-18 NOTE — ED Notes (Signed)
Pt is resting in bed at this time 

## 2011-03-18 NOTE — ED Notes (Signed)
Pt has been denied from [facility].

## 2011-03-18 NOTE — ED Notes (Signed)
Patient is resting comfortably. 

## 2011-03-18 NOTE — ED Notes (Signed)
Patient ambulated to bathroom.

## 2011-03-18 NOTE — ED Notes (Signed)
Pt up to bathroom with sitter.  nad noted.

## 2011-03-18 NOTE — ED Notes (Signed)
Per Natalia Leatherwood with ACT - pt pending acceptance at [facility].

## 2011-03-18 NOTE — ED Notes (Signed)
Pt care report received from [place], [place].  Pt in room and resting with eyes closed.

## 2011-03-18 NOTE — ED Notes (Signed)
Pt getting somewhat agitated.  When rounding, asked if needed anything, pt states, "I got people coming to get me."  RN asked if pt called anyone.  Pt states, "No, but they know I'm here."

## 2011-03-18 NOTE — ED Notes (Signed)
Pt resting in bed, even rise and fall of chest, in NAD. Sitter remains at bedside. Will continue to monitor. 

## 2011-03-18 NOTE — ED Notes (Signed)
Discharge instructions reviewed with pt, questions answered. Pt verbalized understanding.  

## 2011-03-18 NOTE — ED Notes (Signed)
meds given for agitation.  Pt watching tv at this time, not making eye contact with staff.  nad noted.  Sitter at bedside.

## 2011-03-19 NOTE — ED Notes (Signed)
Pt assisted to restroom by sitter. Pt returned to room w/ no complications.

## 2011-03-19 NOTE — ED Notes (Signed)
Spoke with pharmacist, was told pts' invega was on the way

## 2011-03-19 NOTE — ED Notes (Signed)
Pt resting.  Breakfast tray given.  Sitter at bedside.

## 2011-03-19 NOTE — ED Notes (Signed)
Pt watching tv at this time. Laughing out loud. No needs voiced at this time. Sitter remains in site of pt at all times. No acute distress noted at this time.

## 2011-03-19 NOTE — ED Notes (Signed)
Pt ambulated to restroom. Sitter w/ pt. Pt returned to room w/ no complications.

## 2011-03-19 NOTE — ED Notes (Signed)
Spoke w/ ac to bring pt nightly meds. Pt resting calmly w/ eyes closed. Rise & fall of the chest noted. Family at bedside. Bed in low position, side rails up x2. NAD noted at this time.  Sitter remains in site of pt.

## 2011-03-20 NOTE — BH Assessment (Signed)
Assessment Note   [name] is an 20 y.o. male. Patient has been previously assessed twice. His presentation is odd. He makes eye contact briefly. He stated he was suicidal, then stated he was not suicidal. He has a history of non-compliance with medications. He has been taking his medication here. He has not had any outbursts or issues with staff. He has been sleeping and watching TV. He was initially referred to [facility] and [facility] however, he was declined. The second assessor stated that he was referred back and was on a waiting list. When this assessor check on this Saturday morning, patient was on the waiting list. However, now the [facility] is stating he was declined and is not on the waiting list. It is unclear whether he was on the waiting list and was just run and declined. Because of his first admission of SI and his odd presentation, MD would like to refer patient to inpatient treatment, for his benefit.   Assessor has contacted: 1400: Ford stated he was declined at [facility] 1415: Wardell Honour at [facility] stated they do not have any beds availaibe 1420: Shanda Bumps at [facility] stated they do not have any beds available 1430: Asher Muir at [facility] stated there are no beds available at this time 1445: Artist Pais at [facility] stated there are no beds available now 1450: Janelle: [facility]; no beds available  Patient referred to [facility].   Axis I: Bipolar, Depressed Axis II Deferred Axis III  Knee surgery Axis IV: moderate-severe: paranoia Axis V: GAF 22  Past Medical History:  Past Medical History  Diagnosis Date  . Bipolar 1 disorder     Past Surgical History  Procedure Date  . Knee surgery     Family History: History reviewed. No pertinent family history.  Social History:  reports that he has quit smoking. He does not have any smokeless tobacco history on file. He reports that he does not drink alcohol or use illicit drugs.  Additional  Social History:    Allergies: No Known Allergies  Home Medications:  Medications Prior to Admission  Medication Dose Route Frequency Provider Last Rate Last Dose  . acetaminophen (TYLENOL) tablet 650 mg  650 mg Oral Q4H PRN Joya Gaskins, MD      . haloperidol lactate (HALDOL) injection 5 mg  5 mg Intramuscular Once Joya Gaskins, MD   5 mg at 03/16/11 1508  . ibuprofen (ADVIL,MOTRIN) tablet 600 mg  600 mg Oral Q8H PRN Joya Gaskins, MD      . lithium carbonate (ESKALITH) CR tablet 900 mg  900 mg Oral QHS Joya Gaskins, MD   900 mg at 03/19/11 2315  . LORazepam (ATIVAN) injection 2 mg  2 mg Intravenous Once Joya Gaskins, MD   2 mg at 03/16/11 1508  . LORazepam (ATIVAN) tablet 1 mg  1 mg Oral Q4H PRN Joya Gaskins, MD   1 mg at 03/19/11 1813  . mirtazapine (REMERON) tablet 15 mg  15 mg Oral QHS Joya Gaskins, MD   15 mg at 03/19/11 2315  . ondansetron (ZOFRAN) tablet 4 mg  4 mg Oral Q8H PRN Joya Gaskins, MD      . paliperidone (INVEGA) 24 hr tablet 6 mg  6 mg Oral Q0700 Joya Gaskins, MD   6 mg at 03/20/11 0711  . traZODone (DESYREL) tablet 100 mg  100 mg Oral QHS Joya Gaskins, MD  100 mg at 03/19/11 2313  . ziprasidone (GEODON) injection 10 mg  10 mg Intramuscular Once Benny Lennert, MD   10 mg at 03/17/11 1406   Medications Prior to Admission  Medication Sig Dispense Refill  . lithium carbonate (ESKALITH) 450 MG CR tablet Take 900 mg by mouth at bedtime.       . mirtazapine (REMERON) 15 MG tablet Take 15 mg by mouth at bedtime.        . paliperidone (INVEGA) 6 MG 24 hr tablet Take 6 mg by mouth every morning.        . traZODone (DESYREL) 100 MG tablet Take 100 mg by mouth at bedtime.          OB/GYN Status:  No LMP for male patient.  General Assessment Data Location of Assessment: [facility] ACT Assessment: Yes Living Arrangements: Group Home Can pt return to current living arrangement?: Yes Admission Status: Involuntary Is patient capable  of signing voluntary admission?: No Transfer from: Group Home Referral Source: MD  Education Status Is patient currently in school?: No Contact person: [name]  Risk to self Suicidal Ideation: No-Not Currently/Within Last 6 Months Suicidal Intent: No Is patient at risk for suicide?: No Suicidal Plan?: No Access to Means: No What has been your use of drugs/alcohol within the last 12 months?: not reported Previous Attempts/Gestures: No How many times?: 0  Other Self Harm Risks: not reported Triggers for Past Attempts: Unknown Intentional Self Injurious Behavior: None Factors that decrease suicide risk: Positive therapeutic relationships Family Suicide History: No Recent stressful life event(s): Other (Comment) Persecutory voices/beliefs?: No Depression: Yes Depression Symptoms: Isolating;Feeling angry/irritable;Loss of interest in usual pleasures Substance abuse history and/or treatment for substance abuse?: No Suicide prevention information given to non-admitted patients: Yes  Risk to Others Homicidal Ideation: No-Not Currently/Within Last 6 Months Thoughts of Harm to Others: No Current Homicidal Intent: No Current Homicidal Plan: No Access to Homicidal Means: No Identified Victim: none reported History of harm to others?: No Assessment of Violence: In distant past Violent Behavior Description: not currently Does patient have access to weapons?: No Criminal Charges Pending?: No Does patient have a court date: No  Psychosis Hallucinations: None noted Delusions: None noted  Mental Status Report Appear/Hygiene: Improved Eye Contact: Poor Motor Activity: Mannerisms;Shuffling Speech: Soft Level of Consciousness: Alert;Irritable Mood: Depressed Affect: Anxious;Depressed;Irritable;Sad Anxiety Level: Minimal Thought Processes: Coherent Judgement: Impaired Orientation: Person;Place;Situation Obsessive Compulsive Thoughts/Behaviors: Minimal  Cognitive  Functioning Concentration: Decreased Memory: Recent Intact;Remote Intact IQ: Average Insight: Fair Impulse Control: Fair Appetite: Good Weight Loss: 0  Weight Gain: 0  Sleep: No Change Total Hours of Sleep: 7  Vegetative Symptoms: None Vegetative Symptoms: None  Prior Inpatient Therapy Prior Inpatient Therapy: Yes Prior Therapy Dates: 2012 Prior Therapy Facilty/Provider(s): [facility] Reason for Treatment: BIPOLAR-DEPRESSED  Prior Outpatient Therapy Prior Outpatient Therapy: Yes Prior Therapy Dates: 2012 Prior Therapy Facilty/Provider(s): [facility] Reason for Treatment: medications            Values / Beliefs Cultural Requests During Hospitalization: None Spiritual Requests During Hospitalization: None        Additional Information 1:1 In Past 12 Months?: No CIRT Risk: No Elopement Risk: No Does patient have medical clearance?: Yes     Disposition:  Disposition Disposition of Patient: Inpatient treatment program Type of inpatient treatment program: Adult Patient referred to: [facility]  On Site Evaluation by: Dr. Fonnie Jarvis Reviewed with Physician:  Dr. Siri Cole, Maximiano Coss H 03/20/2011 2:51 PM

## 2011-03-20 NOTE — ED Notes (Signed)
Sitter at bedside.

## 2011-03-20 NOTE — ED Provider Notes (Signed)
  Physical Exam  BP 130/78  Pulse 86  Temp(Src) 99.1 F (37.3 C) (Oral)  Resp 18  Ht 5\' 7"  (1.702 m)  Wt 195 lb (88.451 kg)  BMI 30.54 kg/m2  SpO2 100%  Physical Exam  ED Course  Procedures  MDM No complaints thus far this shift.  Last set of vitals were stable.  Holding orders are written for.  Pt is waiting for possible [facility] placement.  Also referred to [facility].  Pt has slept my whole shift.        Gavin Pound. Oletta Lamas, MD 03/20/11 [phone]

## 2011-03-20 NOTE — ED Notes (Signed)
Sitter notified RN of pt masturbating out in the open, pt given a sheet and turned his bed around for privacy. Pt understanding and compliant.

## 2011-03-20 NOTE — ED Notes (Signed)
Pt resting calmly w/ eyes closed. Rise & fall of the chest noted. Family at bedside. Bed in low position, side rails up x2. NAD noted at this time.  Sitter remains in sight of pt at this time.

## 2011-03-20 NOTE — ED Provider Notes (Signed)
Patient is sleeping quietly this afternoon in the ED [facility]  Hurman Horn, MD 03/20/11 [phone]

## 2011-03-20 NOTE — ED Notes (Signed)
Pt agitated, took hospital bracelet off stating it is not his, "has wrong name on it". Pt calmed and redirected easily. Pt given ativan as he was asking for his meds. Sitter remains at bedside.

## 2011-03-20 NOTE — ED Notes (Signed)
Spoke with Lourdes Sledge with ACT team and was told that [facility]

## 2011-03-20 NOTE — ED Notes (Signed)
Pt took 7am medication.  PT says is still sleepy and doesn't want his breakfast right now.

## 2011-03-20 NOTE — ED Notes (Signed)
Pt asking for clean underwear and a shower. Pt instructed a male tech would be here at noon,he could take a shower at that time and would be given clean scrubs at that time. Pt is cooperative and calm at this time.  Sitter at bedside.

## 2011-03-21 NOTE — ED Notes (Signed)
Adjusted daily medication (INVEGA) to 10:00am -- awaiting medication to come from Pharmacy

## 2011-03-21 NOTE — ED Notes (Signed)
New shift. Pt sleeping at present. Pt cont to wait for [facility]pt bed at a [facility]

## 2011-03-21 NOTE — ED Notes (Signed)
Asleep in bed, sitter at bedside.

## 2011-03-21 NOTE — ED Notes (Signed)
Pt awake now. Up to bathroom. Sitter with pt

## 2011-03-21 NOTE — ED Notes (Signed)
Pt cont to wait for am meds from pharm

## 2011-03-21 NOTE — ED Notes (Signed)
Pt asleep in bed, sitter at bedside

## 2011-03-21 NOTE — ED Provider Notes (Signed)
1:32 PM The patient is resting quietly and comfortably in his room at this time in no apparent distress. [facility]  Felisa Bonier, MD 03/21/11 [phone]

## 2011-03-21 NOTE — ED Notes (Signed)
[facility] Waiting for edp to complete paperwork for transport

## 2011-03-21 NOTE — ED Notes (Signed)
[facility]

## 2011-03-21 NOTE — BH Assessment (Signed)
Assessment Note   [name] is an 20 y.o. male.  He has been in the ED since admission on 03/26/11. He remains psychotic with both hallucinations and delusions. Due to several outburst of aggressive behaviors and the need for sedation, he was not accepted by numerous psych facilities. He has been pending acceptance at [facility]   Axis I: Bipolar, Depressed Axis II: Deferred Axis III:  Past Medical History  Diagnosis Date  . Bipolar 1 disorder    Axis IV: economic problems, housing problems, problems related to social environment, problems with access to health care services and problems with primary support group Axis V: 21-30 behavior considerably influenced by delusions or hallucinations OR serious impairment in judgment, communication OR inability to function in almost all areas  Past Medical History:  Past Medical History  Diagnosis Date  . Bipolar 1 disorder     Past Surgical History  Procedure Date  . Knee surgery     Family History: History reviewed. No pertinent family history.  Social History:  reports that he has quit smoking. He does not have any smokeless tobacco history on file. He reports that he does not drink alcohol or use illicit drugs.  Additional Social History:    Allergies: No Known Allergies  Home Medications:  Medications Prior to Admission  Medication Dose Route Frequency Provider Last Rate Last Dose  . acetaminophen (TYLENOL) tablet 650 mg  650 mg Oral Q4H PRN Joya Gaskins, MD      . haloperidol lactate (HALDOL) injection 5 mg  5 mg Intramuscular Once Joya Gaskins, MD   5 mg at 03/16/11 1508  . ibuprofen (ADVIL,MOTRIN) tablet 600 mg  600 mg Oral Q8H PRN Joya Gaskins, MD      . lithium carbonate (ESKALITH) CR tablet 900 mg  900 mg Oral QHS Joya Gaskins, MD   900 mg at 03/20/11 2245  . LORazepam (ATIVAN) injection 2 mg  2 mg Intravenous Once Joya Gaskins, MD   2 mg at 03/16/11 1508  . LORazepam (ATIVAN) tablet 1 mg  1 mg Oral Q4H  PRN Joya Gaskins, MD   1 mg at 03/20/11 1655  . mirtazapine (REMERON) tablet 15 mg  15 mg Oral QHS Joya Gaskins, MD   15 mg at 03/20/11 2246  . ondansetron (ZOFRAN) tablet 4 mg  4 mg Oral Q8H PRN Joya Gaskins, MD      . paliperidone (INVEGA) 24 hr tablet 6 mg  6 mg Oral Q0700 Joya Gaskins, MD   6 mg at 03/20/11 0711  . traZODone (DESYREL) tablet 100 mg  100 mg Oral QHS Joya Gaskins, MD   100 mg at 03/20/11 2247  . ziprasidone (GEODON) injection 10 mg  10 mg Intramuscular Once Benny Lennert, MD   10 mg at 03/17/11 1406   Medications Prior to Admission  Medication Sig Dispense Refill  . lithium carbonate (ESKALITH) 450 MG CR tablet Take 900 mg by mouth at bedtime.       . mirtazapine (REMERON) 15 MG tablet Take 15 mg by mouth at bedtime.        . paliperidone (INVEGA) 6 MG 24 hr tablet Take 6 mg by mouth every morning.        . traZODone (DESYREL) 100 MG tablet Take 100 mg by mouth at bedtime.          OB/GYN Status:  No LMP for male patient.  General Assessment Data Location of  Assessment: [facility] ACT Assessment: Yes Living Arrangements: Family members Can pt return to current living arrangement?: Yes Admission Status: Involuntary Is patient capable of signing voluntary admission?: No Transfer from: Acute Hospital Referral Source: MD  Education Status Is patient currently in school?: No Contact person: [name]  Risk to self Suicidal Ideation: No-Not Currently/Within Last 6 Months Suicidal Intent: No Is patient at risk for suicide?: No Suicidal Plan?: No Access to Means: No What has been your use of drugs/alcohol within the last 12 months?: not reported Previous Attempts/Gestures: No How many times?: 0  Other Self Harm Risks: not reported Triggers for Past Attempts: Unknown Intentional Self Injurious Behavior: None Factors that decrease suicide risk: Positive therapeutic relationships Family Suicide History: No Recent stressful life event(s):  Conflict (Comment) (with family) Persecutory voices/beliefs?: No Depression: Yes Depression Symptoms: Isolating;Feeling angry/irritable;Loss of interest in usual pleasures Substance abuse history and/or treatment for substance abuse?: No Suicide prevention information given to non-admitted patients: Not applicable  Risk to Others Homicidal Ideation: No Thoughts of Harm to Others: No Current Homicidal Intent: No Current Homicidal Plan: No Access to Homicidal Means: No Identified Victim: none reported History of harm to others?: No Assessment of Violence: None Noted Violent Behavior Description: no Does patient have access to weapons?: No Criminal Charges Pending?: No Does patient have a court date: No  Psychosis Hallucinations: Auditory Delusions: Grandiose  Mental Status Report Appear/Hygiene: Improved Eye Contact: Poor Motor Activity: Mannerisms Speech: Soft Level of Consciousness: Alert;Irritable Mood: Depressed Affect: Anxious;Depressed;Irritable;Sad Anxiety Level: Minimal Thought Processes: Coherent Judgement: Impaired Orientation: Person;Place;Situation Obsessive Compulsive Thoughts/Behaviors: Minimal  Cognitive Functioning Concentration: Decreased Memory: Recent Intact;Remote Intact IQ: Average Insight: Fair Impulse Control: Fair Appetite: Good Weight Loss: 0  Weight Gain: 0  Sleep: No Change Total Hours of Sleep: 7  Vegetative Symptoms: None Vegetative Symptoms: None  Prior Inpatient Therapy Prior Inpatient Therapy: Yes Prior Therapy Dates: 2012 Prior Therapy Facilty/Provider(s): [facility] Reason for Treatment: BIPOLAR-DEPRESSED  Prior Outpatient Therapy Prior Outpatient Therapy: Yes Prior Therapy Dates: 2012 Prior Therapy Facilty/Provider(s): [facility] Reason for Treatment: medications            Values / Beliefs Cultural Requests During Hospitalization: None Spiritual Requests During Hospitalization: None        Additional  Information 1:1 In Past 12 Months?: No CIRT Risk: No Elopement Risk: No Does patient have medical clearance?: Yes     Disposition: [name] was accepted to [facility]. Junious Dresser called  and requested updated medical and medications. She also requested vitals before the patient could be transferred. Information gathered and faxed to [facility].  Dr Doylene Canard will complete transfer. He is in agreement with disposition. Disposition Disposition of Patient: Inpatient treatment program Type of inpatient treatment program: Adult Patient referred to: [facility]  On Site Evaluation by:   Reviewed with Physician:     Jearld Pies 03/21/2011 1:12 PM

## 2011-06-30 DIAGNOSIS — F259 Schizoaffective disorder, unspecified: Secondary | ICD-10-CM | POA: Diagnosis not present

## 2011-07-04 DIAGNOSIS — F3110 Bipolar disorder, current episode manic without psychotic features, unspecified: Secondary | ICD-10-CM | POA: Diagnosis not present

## 2011-07-04 DIAGNOSIS — F319 Bipolar disorder, unspecified: Secondary | ICD-10-CM | POA: Diagnosis not present

## 2011-07-07 DIAGNOSIS — R7989 Other specified abnormal findings of blood chemistry: Secondary | ICD-10-CM | POA: Diagnosis not present

## 2011-09-07 DIAGNOSIS — F259 Schizoaffective disorder, unspecified: Secondary | ICD-10-CM | POA: Diagnosis not present

## 2011-09-07 DIAGNOSIS — F3189 Other bipolar disorder: Secondary | ICD-10-CM | POA: Diagnosis not present

## 2011-10-24 ENCOUNTER — Encounter (HOSPITAL_COMMUNITY): Payer: Self-pay | Admitting: *Deleted

## 2011-10-24 ENCOUNTER — Emergency Department (HOSPITAL_COMMUNITY)
Admission: EM | Admit: 2011-10-24 | Discharge: 2011-10-24 | Disposition: A | Discharge status: Other Acute Inpatient Hospital | Payer: Medicare Other | Attending: Psychiatry | Admitting: Psychiatry

## 2011-10-24 DIAGNOSIS — F29 Unspecified psychosis not due to a substance or known physiological condition: Secondary | ICD-10-CM | POA: Insufficient documentation

## 2011-10-24 DIAGNOSIS — R45850 Homicidal ideations: Secondary | ICD-10-CM | POA: Diagnosis not present

## 2011-10-24 LAB — CBC WITH DIFFERENTIAL/PLATELET
Basophils Absolute: 0 10*3/uL (ref 0.0–0.1)
Basophils Relative: 1 % (ref 0–1)
Eosinophils Absolute: 0 10*3/uL (ref 0.0–0.7)
Eosinophils Relative: 0 % (ref 0–5)
MCH: 29 pg (ref 26.0–34.0)
MCHC: 34.2 g/dL (ref 30.0–36.0)
MCV: 84.9 fL (ref 78.0–100.0)
Platelets: 162 10*3/uL (ref 150–400)
RDW: 12.4 % (ref 11.5–15.5)
WBC: 7.2 10*3/uL (ref 4.0–10.5)

## 2011-10-24 LAB — URINALYSIS, ROUTINE W REFLEX MICROSCOPIC
Glucose, UA: NEGATIVE mg/dL
Hgb urine dipstick: NEGATIVE
Leukocytes, UA: NEGATIVE
Specific Gravity, Urine: <1.005 — ABNORMAL LOW (ref 1.005–1.030)
Urobilinogen, UA: 0.2 mg/dL (ref 0.0–1.0)

## 2011-10-24 LAB — RAPID URINE DRUG SCREEN, HOSP PERFORMED
Opiates: NOT DETECTED
Tetrahydrocannabinol: NOT DETECTED

## 2011-10-24 LAB — BASIC METABOLIC PANEL
Calcium: 9.9 mg/dL (ref 8.4–10.5)
GFR calc non Af Amer: >90 mL/min (ref >90)
Sodium: 140 mEq/L (ref 135–145)

## 2011-10-24 LAB — ETHANOL: Alcohol, Ethyl (B): <11 mg/dL (ref 0–11)

## 2011-10-24 MED ORDER — IBUPROFEN 400 MG PO TABS: 600.0000 mg | ORAL_TABLET | Freq: Three times a day (TID) | ORAL | Status: DC | PRN

## 2011-10-24 MED ORDER — LORAZEPAM 1 MG PO TABS: 1.0000 mg | ORAL_TABLET | ORAL | Status: DC | PRN

## 2011-10-24 MED ORDER — HALOPERIDOL 5 MG PO TABS
5.0000 mg | ORAL_TABLET | Freq: Once | ORAL | Status: AC
  Administered 2011-10-24: 5 mg via ORAL
  Filled 2011-10-24: qty 1

## 2011-10-24 MED ORDER — ONDANSETRON HCL 4 MG PO TABS: 4.0000 mg | ORAL_TABLET | Freq: Three times a day (TID) | ORAL | Status: DC | PRN

## 2011-10-24 MED ORDER — HALOPERIDOL 5 MG PO TABS
5.0000 mg | ORAL_TABLET | Freq: Two times a day (BID) | ORAL | Status: DC
  Administered 2011-10-24: 5 mg via ORAL
  Filled 2011-10-24: qty 1

## 2011-10-24 NOTE — ED Notes (Signed)
Pt transported via [employer]. Going to [facility].

## 2011-10-24 NOTE — ED Notes (Signed)
Pt accepted to [facility] but can not be transferred until after midnight.

## 2011-10-24 NOTE — ED Provider Notes (Signed)
History     CSN: [id number]  Arrival date & time 10/24/11  1506   First MD Initiated Contact with Patient 10/24/11 1708      Chief Complaint  Patient presents with  . Medical Clearance     Patient is a 21 y.o. male presenting with mental health disorder. The history is provided by the patient and the police. The history is limited by the condition of the patient.  Mental Health Problem The primary symptoms include bizarre behavior. Episode onset: an unknown time ago. This is a recurrent problem.  The degree of incapacity that he is experiencing as a consequence of his illness is severe. Associated symptoms comments: Homicidal .   his condition is worsening and nothing improves his symptoms pt presents from [facility] with IVC paperwork and police are present.  Apparently he has not been taking his meds, he has been acting erratically and has threatened to kill his grandmother.  When I spoke to patient he reports "my eyes are green can't you see"  No other issues voiced by patient  PMH - psychosis  Past Surgical History  Procedure Date  . Knee surgery     History reviewed. No pertinent family history.  History  Substance Use Topics  . Smoking status: Never Smoker   . Smokeless tobacco: Not on file  . Alcohol Use: No      Review of Systems  Unable to perform ROS: Mental status change    Allergies  Review of patient's allergies indicates no known allergies.  Home Medications  No current outpatient prescriptions on file.  BP 141/91  Pulse 77  Temp 99.6 F (37.6 C) (Oral)  Resp 20  Wt 187 lb (84.823 kg)  SpO2 99%  Physical Exam CONSTITUTIONAL: Well developed/well nourished, eating a meal HEAD AND FACE: Normocephalic/atraumatic EYES: EOMI/PERRL ENMT: Mucous membranes moist NECK: supple no meningeal signs SPINE:entire spine nontender CV: S1/S2 noted, no murmurs/rubs/gallops noted LUNGS: Lungs are clear to auscultation bilaterally, no apparent  distress ABDOMEN: soft, nontender, no rebound or guarding NEURO: Pt is awake/alert, moves all extremitiesx4.  No distress, eating a meal EXTREMITIES: pulses normal, full ROM SKIN: warm, color normal PSYCH: pt tells me his eyes are green  ED Course  Procedures   Labs Reviewed  CBC WITH DIFFERENTIAL  BASIC METABOLIC PANEL  ETHANOL  URINE RAPID DRUG SCREEN (HOSP PERFORMED)  URINALYSIS, ROUTINE W REFLEX MICROSCOPIC  6:44 PM Pt presents with IVC paperwork and presenting with psychotic features Holding orders completed and ACT was consulted who will see patient   MDM  Nursing notes including past medical history and social history reviewed and considered in documentation labs/vitals reviewed and considered         Joya Gaskins, MD 10/24/11 1846

## 2011-10-24 NOTE — ED Notes (Signed)
Pt not responding to questions, pt mumbling out loud. Pt focused on the tv. Police offer remains outside pt room.

## 2011-10-24 NOTE — ED Notes (Signed)
Pt transported via [employer] going to [facility]. Officer given all paperwork & Pt belongings.
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
## 2011-10-24 NOTE — ED Notes (Addendum)
Brought in by police with commitment papers,  Says he is not taking his meds and is voicing thoughts of killing his grandmother.per papers.  Pt  Is largely nonverbal at triage.  Answers with nod of head  And said part of his medical hx thru gritted teeth.

## 2011-10-25 ENCOUNTER — Encounter (HOSPITAL_COMMUNITY): Payer: Self-pay | Admitting: Emergency Medicine

## 2011-10-25 ENCOUNTER — Inpatient Hospital Stay (HOSPITAL_COMMUNITY)
Admission: AD | Admit: 2011-10-25 | Discharge: 2011-11-01 | DRG: 885 | Disposition: A | Discharge status: Home / Self Care | Payer: Medicare Other | Source: Ambulatory Visit | Attending: Psychiatry | Admitting: Psychiatry

## 2011-10-25 DIAGNOSIS — F259 Schizoaffective disorder, unspecified: Principal | ICD-10-CM

## 2011-10-25 DIAGNOSIS — Z79899 Other long term (current) drug therapy: Secondary | ICD-10-CM | POA: Diagnosis not present

## 2011-10-25 DIAGNOSIS — F250 Schizoaffective disorder, bipolar type: Secondary | ICD-10-CM | POA: Diagnosis present

## 2011-10-25 DIAGNOSIS — R45850 Homicidal ideations: Secondary | ICD-10-CM | POA: Diagnosis not present

## 2011-10-25 MED ORDER — LITHIUM CARBONATE 300 MG PO CAPS
600.0000 mg | ORAL_CAPSULE | Freq: Two times a day (BID) | ORAL | Status: DC
  Administered 2011-10-25 – 2011-10-28 (×7): 600 mg via ORAL
  Filled 2011-10-25: qty 2
  Filled 2011-10-25: qty 1
  Filled 2011-10-25: qty 2
  Filled 2011-10-25: qty 1
  Filled 2011-10-25 (×8): qty 2

## 2011-10-25 MED ORDER — MAGNESIUM HYDROXIDE 400 MG/5ML PO SUSP: 30.0000 mL | Freq: Every day | ORAL | Status: DC | PRN

## 2011-10-25 MED ORDER — ALUM & MAG HYDROXIDE-SIMETH 200-200-20 MG/5ML PO SUSP: 30.0000 mL | ORAL | Status: DC | PRN

## 2011-10-25 MED ORDER — BENZTROPINE MESYLATE 0.5 MG PO TABS: 0.5000 mg | ORAL_TABLET | Freq: Two times a day (BID) | ORAL | Status: DC | PRN

## 2011-10-25 MED ORDER — NICOTINE 21 MG/24HR TD PT24
21.0000 mg | MEDICATED_PATCH | Freq: Every day | TRANSDERMAL | Status: DC
  Administered 2011-10-27 – 2011-11-01 (×6): 21 mg via TRANSDERMAL
  Filled 2011-10-25 (×11): qty 1

## 2011-10-25 MED ORDER — RISPERIDONE MICROSPHERES 25 MG IM SUSR
37.5000 mg | Freq: Once | INTRAMUSCULAR | Status: AC
  Administered 2011-10-25: 37.5 mg via INTRAMUSCULAR
  Filled 2011-10-25: qty 4

## 2011-10-25 MED ORDER — HALOPERIDOL 5 MG PO TABS
5.0000 mg | ORAL_TABLET | Freq: Two times a day (BID) | ORAL | Status: DC
  Administered 2011-10-25: 5 mg via ORAL
  Filled 2011-10-25 (×5): qty 1

## 2011-10-25 MED ORDER — LORAZEPAM 1 MG PO TABS
1.0000 mg | ORAL_TABLET | Freq: Four times a day (QID) | ORAL | Status: DC | PRN
  Administered 2011-10-28 – 2011-10-31 (×4): 1 mg via ORAL
  Filled 2011-10-25 (×4): qty 1

## 2011-10-25 MED ORDER — RISPERIDONE 2 MG PO TABS
4.0000 mg | ORAL_TABLET | Freq: Every day | ORAL | Status: DC
  Administered 2011-10-25 – 2011-10-31 (×7): 4 mg via ORAL
  Filled 2011-10-25: qty 28
  Filled 2011-10-25 (×9): qty 2

## 2011-10-25 MED ORDER — ACETAMINOPHEN 325 MG PO TABS: 650.0000 mg | ORAL_TABLET | Freq: Four times a day (QID) | ORAL | Status: DC | PRN

## 2011-10-25 NOTE — Progress Notes (Signed)
Patient ID: [name], male   DOB: [birth year], 20 y.o.   MRN: [id number]  Pt Admitted to [facility] involuntarily for Bipolar and schizophrenia. Per RN report, pt not taking medications and was hearing voices telling him to kill his grandmother. Pt refusing to answer admission questions and is continuously staring at RN; pt appearing angered with questions. Pt appearing to be responding to internal stimuli. Pt slow to follow commands and with delayed reactions. Unit rules and regulations explained to patient. Will monitor patient Q 15 minutes for safety.

## 2011-10-25 NOTE — Progress Notes (Signed)
Psychoeducational Group Note  Date:  10/25/2011 Time: 2030  Group Topic/Focus:  Wrap-Up Group:   The focus of this group is to help patients review their daily goal of treatment and discuss progress on daily workbooks.  Participation Level:  Did Not Attend  Participation Quality:  N/A Did not attend group  Affect:    Cognitive:    Insight:    Engagement in Group:    Additional Comments:  Patient did not attend wrap-up group this evening. Patient was in bed, awake for the duration of wrap-up group this evening.  [name], [name] 10/25/2011, 8:58 PM

## 2011-10-25 NOTE — Tx Team (Signed)
Not reviewed today 10/25/11 because patient refused to come to Treatment Team.  Ambrose Mantle, LCSW  10/25/2011, 2:17 PM     Interdisciplinary Treatment Plan Update (Adult)  Date:  10/25/2011  Time Reviewed:  10:15AM-11:15AM  Progress in Treatment: Attending groups:   Participating in groups:     Taking medication as prescribed:     Tolerating medication:    Family/Significant other contact made:   Patient understands diagnosis:    Discussing patient identified problems/goals with staff:    Medical problems stabilized or resolved:    Denies suicidal/homicidal ideation:   Issues/concerns per patient self-inventory:    Other:    New problem(s) identified:   Reason for Continuation of Hospitalization:   Interventions implemented related to continuation of hospitalization:  Medication monitoring and adjustment, safety checks Q15 min., suicide risk assessment, group therapy, psychoeducation, collateral contact, aftercare planning, ongoing physician assessments, medication education  Additional comments:  Not applicable  Estimated length of stay:    Discharge Plan:    New goal(s):  Not applicable  Review of initial/current patient goals per problem list:   1.  Goal(s):  Medication stabilization  Met:    Target date:  By Discharge   As evidenced by:    2.  Goal(s):  Develop plan to improve medication compliance  Met:    Target date:  By Discharge   As evidenced by:    3.  Goal(s):  Deny HI for 48 hours prior to D/C.  Met:    Target date:  By Discharge   As evidenced by:    4.  Goal(s):  Reduce psychosis to baseline.  Met:    Target date:  By Discharge   As evidenced by:    Attendees: Patient:  Lance Huaracha  10/25/2011 10:15AM-11:15AM  Family:     Physician:  Dr. Nelly Rout 10/25/2011 10:15AM-11:15AM  Nursing:  Joslyn Devon, RN 10/25/2011 10:15AM -11:15AM   Case Manager:  Ambrose Mantle, LCSW 10/25/2011 10:15AM-11:15AM  Counselor:   Veto Kemps, MT-BC 10/25/2011 10:15AM-11:15AM  Other:   Neill Loft, RN 10/25/2011   Other:      Other:      Other:       Scribe for Treatment Team:   Sarina Ser, 10/25/2011, 10:15AM-11:15AM
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
## 2011-10-25 NOTE — Progress Notes (Signed)
D: Pt has been in bed most of shift. Had to be awoken for HS meds. He was guarded and brief with responses. Calm and cooperative. No acute distress noted. Provided minimal information when answering assessment questions, usually answering with nodding his head yes or no. Did not endorse AVH/SI/HI and contracted for safety. Offered no questions or concerns.  A: Support and encouragement provided. Safety has been maintained with Q15 minute observation. Medications dispensed as ordered by MD. POC and medications for the shift reviewed and understanding verbalized.  R: Pt remains safe. He is compliant with his treatment goals. Will continue current POC.

## 2011-10-25 NOTE — [facility] Suicide Risk Assessment (Signed)
Suicide Risk Assessment  Admission Assessment     Demographic factors:  Assessment Details Time of Assessment: Admission Information Obtained From: Patient Current Mental Status:    Loss Factors:    Historical Factors:    Risk Reduction Factors:     CLINICAL FACTORS:   Severe Anxiety and/or Agitation Schizophrenia:   Less than 21 years old Paranoid or undifferentiated type More than one psychiatric diagnosis Currently Psychotic Unstable or Poor Therapeutic Relationship Previous Psychiatric Diagnoses and Treatments  COGNITIVE FEATURES THAT CONTRIBUTE TO RISK:  Closed-mindedness Loss of executive function Polarized thinking    SUICIDE RISK:   Minimal: No identifiable suicidal ideation.  Patients presenting with no risk factors but with morbid ruminations; may be classified as minimal risk based on the severity of the depressive symptoms  PLAN OF CARE: Patient is homicidal towards mother and grandmother, has been noncompliant with his medications and is currently psychotic. Patient is being restarted back on his medications to help with the psychosis While here patient will attend groups and was also working understanding his illness the need for treatment and medication compliance Patient also has ACT team involvement which will be continued after discharge   [facility] 10/25/2011, 2:13 PM

## 2011-10-25 NOTE — Discharge Planning (Signed)
Met with patient in Aftercare Planning Group and provided today's workbook based on theme of the day. He kept his close throughout most of group, and did not respond to most questions asked either to the group or to him personally.  He just got here in the early hours so Case Manager did not push.  Patient did sign consent for Case Manager to obtain his [facility] ACTT medications, and when this list was received, Case Manager gave to doctor.  Patient was due a Risperdal Consta injection yesterday per phone call from the ACT Team.    Initial report states that patient lives with his mother and grandmother, but he was not willing to discuss this today, and Case Manager did not pursue this topic, as he has most recently in assessment been homicidal toward both of them.  No case management needs today.  Ambrose Mantle, LCSW 10/25/2011, 2:21 PM \

## 2011-10-25 NOTE — Tx Team (Signed)
Initial Interdisciplinary Treatment Plan  PATIENT STRENGTHS: (choose at least two) Physical Health  PATIENT STRESSORS: Medication change or noncompliance   PROBLEM LIST: Problem List/Patient Goals Date to be addressed Date deferred Reason deferred Estimated date of resolution  Schizophrenia 10/25/11     Medication noncompliance 10/25/11                                                DISCHARGE CRITERIA:  Improved stabilization in mood, thinking, and/or behavior Need for constant or close observation no longer present Verbal commitment to aftercare and medication compliance  PRELIMINARY DISCHARGE PLAN: Attend aftercare/continuing care group Return to previous living arrangement  PATIENT/FAMIILY INVOLVEMENT: This treatment plan has been presented to and reviewed with the patient, [name], and/or family member, .  The patient and family have been given the opportunity to ask questions and make suggestions.  [name] 10/25/2011, 3:25 AM

## 2011-10-25 NOTE — Progress Notes (Signed)
Patient has had minimal contact with staff today.  He did attend discharge planning group this am and would only acknowledge being spoken to by nodding his head.  [facility] ACT[facility] sent his risperdal consta shot; MD put in order to administer.  He tolerated his medication well.  Patient did not want to take his lithium, but agreed stating, "it makes me mad when I take this."  Patient refused to fill out self inventory sheet; staff has difficulty getting responses from him.  He is currently in bed with the sheets over his head.  He remains irritable with labile mood.    Continue to monitor medication management and MD order.  Maintain 15 minute safety checks.  Patient has minimal interaction with staff or peers.  Compliant with medication.

## 2011-10-25 NOTE — H&P (Signed)
Agree with above 

## 2011-10-25 NOTE — Progress Notes (Signed)
Psychoeducational Group Note  Date:  10/25/2011 Time:  1100  Group Topic/Focus:  Recovery Goals:   The focus of this group is to identify appropriate goals for recovery and establish a plan to achieve them.  Participation Level:  Did Not Attend  Participation Quality:  Did not attend  Affect:  Did not attend  Cognitive:  Did not attend  Insight:  Did not attend  Engagement in Group:  did not attend  Additional Comments:  Pt did not attend group, due to late admission into the hospital, nurse given patient the opportunity to rest.  Karleen Hampshire Brittini 10/25/2011, 2:20 PM
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
## 2011-10-26 NOTE — Progress Notes (Signed)
10/26/2011         Time: 0930      Group Topic/Focus: The focus of the group is on enhancing the patients' ability to utilize positive relaxation strategies by practicing several that can be used at discharge.  Participation Level: Minimal  Participation Quality: Drowsy  Affect: Blunted  Cognitive: Alert   Additional Comments: Patient very drowsy, reports "its all those pills they gave me."  Monte Zinni 10/26/2011 11:43 AM

## 2011-10-26 NOTE — Progress Notes (Signed)
D.  Pt. Quiet spoken and guarded.  Denies SI/HI and denies A/V hallucinations.  No concerns of issues voiced at present. A.  Support given.  Pt. Told to notify Clinical research associate or staff with any concerns. R.  Pt. Remains safe.

## 2011-10-26 NOTE — Progress Notes (Signed)
Patient ID: [name], male   DOB: [birth year], 20 y.o.   MRN: [id number]   D: Patient lying in room on approach. Guarded when speaking with him this am. Initially denies feelings of depression but then put "8" on self inventory sheet. Put "7" for feelings of hopelessness. Did tell undersigned this am that the reason he was hear in the hospital was because he didn't tell the truth. Currently denies any SI or HI. Denied auditory hallucinations but looked away when answering this question. Patient slightly irritable when answering questions but did give brief answers. Attending morning groups this am. A: Staff will monitor and encourage group attendance. R: Patient took lithium without difficulty this am and went to discharge planning group.

## 2011-10-26 NOTE — Progress Notes (Signed)
[facility] MD Progress Note  10/26/2011 1:52 PM  Diagnosis:  Axis I: Schizoaffective disorder, bipolar type  ADL's:  Impaired  Sleep: Fair  Appetite:  Fair  Suicidal Ideation:  Plan:  None Homicidal Ideation:  Plan:  Patient was homicidal towards mother and grandmother  AEB (as evidenced by):  Mental Status Examination/Evaluation: Objective:  Appearance: Disheveled  Patent attorney::  Fair  Speech:  Blocked  Volume:  Normal  Mood:  Depressed, Dysphoric and Irritable  Affect:  Blunt and Congruent  Thought Process:  Disorganized and Loose  Orientation:  Other:  Place and person  Thought Content:  Delusions and Hallucinations: None per patient but he seems to be responding to internal stimuli   Suicidal Thoughts:  No  Homicidal Thoughts:  Yes.  with intent/plan to harm his mother and grandmother   Memory:  Immediate;   Fair  Judgement:  Poor  Insight:  Shallow  Psychomotor Activity:  Mannerisms  Concentration:  Poor  Recall:  Poor  Akathisia:  No    AIMS (if indicated):     Assets:  Housing Physical Health Social Support  Sleep:  Number of Hours: 6.5    Vital Signs:Blood pressure 128/68, pulse 133, temperature 98 F (36.7 C), temperature source Oral, resp. rate 18, height 5\' 7"  (1.702 m), weight 189 lb (85.73 kg). Current Medications: Current Facility-Administered Medications  Medication Dose Route Frequency Provider Last Rate Last Dose  . acetaminophen (TYLENOL) tablet 650 mg  650 mg Oral Q6H PRN Mickeal Skinner, MD      . alum & mag hydroxide-simeth (MAALOX/MYLANTA) 200-200-20 MG/5ML suspension 30 mL  30 mL Oral Q4H PRN Mickeal Skinner, MD      . benztropine (COGENTIN) tablet 0.5 mg  0.5 mg Oral BID PRN Mickeal Skinner, MD      . lithium carbonate capsule 600 mg  600 mg Oral BID WC Verne Spurr, PA-C   600 mg at 10/26/11 0818  . LORazepam (ATIVAN) tablet 1 mg  1 mg Oral Q6H PRN Mickeal Skinner, MD      . magnesium hydroxide (MILK OF MAGNESIA) suspension 30 mL  30 mL Oral Daily  PRN Mickeal Skinner, MD      . nicotine (NICODERM CQ - dosed in mg/24 hours) patch 21 mg  21 mg Transdermal Q0600 Mickeal Skinner, MD      . risperiDONE (RISPERDAL) tablet 4 mg  4 mg Oral QHS Verne Spurr, PA-C   4 mg at 10/25/11 2210  . risperiDONE microspheres (RISPERDAL CONSTA) injection 37.5 mg  37.5 mg Intramuscular Once [employer], PA-C   37.5 mg at 10/25/11 1543  . DISCONTD: haloperidol (HALDOL) tablet 5 mg  5 mg Oral BID Mickeal Skinner, MD   5 mg at 10/25/11 0800    Lab Results:  Results for orders placed during the hospital encounter of 10/25/11 (from the past 48 hour(s))  LITHIUM LEVEL     Status: Abnormal   Collection Time   10/25/11  8:26 PM      Component Value Range Comment   Lithium Lvl 0.31 (*) 0.80 - 1.40 mEq/L    on the patient's laboratory findings were within normal limits which include a comprehensive metabolic panel, urinalysis and a urine toxicology screen  Physical Findings: Review of systems: Patient's gaze and gait is intact. Patient denies any chest pain, shortness of breath, wheezing, any palpitation. Patient denies any abdominal pain, nausea, vomiting. Patient also denies any dysuria AIMS: Facial and Oral Movements Muscles of Facial Expression: None, normal Lips and Perioral  Area: None, normal Jaw: None, normal Tongue: None, normal,Extremity Movements Upper (arms, wrists, hands, fingers): None, normal Lower (legs, knees, ankles, toes): None, normal, Trunk Movements Neck, shoulders, hips: None, normal, Overall Severity Severity of abnormal movements (highest score from questions above): None, normal Incapacitation due to abnormal movements: None, normal Patient's awareness of abnormal movements (rate only patient's report): No Awareness, Dental Status Current problems with teeth and/or dentures?: No Does patient usually wear dentures?: No  CIWA:    COWS:     Treatment Plan Summary: Daily contact with patient to assess and evaluate symptoms and progress  in treatment Medication management Patient is currently psychotic , in order to for the patient to be discharged he psychosis needs to remit Patient also needs education in regards to his diagnosis treatment and the need for medication compliance  Plan: Patient yesterday received his Risperdal Consta injection 37.5 mg IM. Will continue patient on risperidone 4 mg at bedtime for psychosis Will continue lithium carbonate 600 mg twice daily for mood stabilization Labs reviewed Patient to participate in groups  Rhona Fusilier 10/26/2011, 1:52 PM

## 2011-10-26 NOTE — Progress Notes (Signed)
Psychoeducational Group Note  Date:  10/26/2011 Time:  2000  Group Topic/Focus:  Wrap-Up Group:   The focus of this group is to help patients review their daily goal of treatment and discuss progress on daily workbooks.  Participation Level:  Minimal  Participation Quality:  Appropriate  Affect:  Flat  Cognitive:  Alert  Insight:  Limited  Engagement in Group:  Limited  Additional Comments:  Pt did attend group this evening and stated that he had a good day, pt did seem to be somewhat distracted by internal stimuli.  Kaleen Odea R 10/26/2011, 9:18 PM

## 2011-10-26 NOTE — Progress Notes (Signed)
Psychoeducational Group Note  Date:  10/26/2011 Time:  1100  Group Topic/Focus:  Crisis Planning:   The purpose of this group is to help patients create a crisis plan for use upon discharge or in the future, as needed.  Participation Level:  Minimal  Participation Quality:  Resistant  Affect:  Appropriate  Cognitive:  Appropriate  Insight:  Good  Engagement in Group:  Good  Additional Comments:none  Ramaya Guile, Genia Plants 10/26/2011, 1:38 PM

## 2011-10-26 NOTE — Progress Notes (Signed)
[facility] Group Notes:  (Counselor/Nursing/MHT/Case Management/Adjunct)  10/26/2011 9:38 AM  Type of Therapy:  Group Therapy 10/25/11   Participation Level:  Did Not Attend  [name] 10/26/2011, 9:38 AM

## 2011-10-26 NOTE — Discharge Planning (Signed)
Met with patient in Aftercare Planning Group and provided today's workbook based on theme of the day. He was much more alert and verbal than yesterday, remained blunted and appeared depressed.  Patient is willing to go to a family care home, and stated that he understands his mother cannot continue to care for him.  He wants as much independence as possible.  Case Manager took time to describe the ways in which the [facility] can help him to obtain that independence and pursue his goals.  Case Manager continues to work on submitting all the requested documents on patient's PASARR request, in order to get him approved for group home placement.  There is one group home in [place] called [facility] who is willing to talk with him about coming there.  Ambrose Mantle, LCSW 10/26/2011, 12:47 PM
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
## 2011-10-27 NOTE — Progress Notes (Signed)
Currently resting quietly in bed with eyes closed. Respirations are even and unlabored. No acute distress noted. Safety has been maintained with Q15 minute observation. Will continue to current POC. 

## 2011-10-27 NOTE — Tx Team (Signed)
Interdisciplinary Treatment Plan Update (Adult)   Date: 10/27/2011   Time Reviewed: 10:15AM-11:15AM   Progress in Treatment:  Attending groups: Yes Participating in groups: Yes Taking medication as prescribed: Yes, here in the hospital.  Expresses hesitation about taking medication.  Does not feel it is necessary to take medications to overcome his illness. Tolerating medication: Yes -- is drinking a lot and his Lithium level is low (0.31) Family/Significant other contact made: Not yet made, not determined whether this is necessary since he is going to a group home, hopefully. Patient understands diagnosis: No Discussing patient identified problems/goals with staff: Yes Medical problems stabilized or resolved: Yes Denies suicidal/homicidal ideation:  Yes Issues/concerns per patient self-inventory: None Other:   New problem(s) identified:  Lithium level is low  Reason for Continuation of Hospitalization:  Hallucinations, depression, medication stabilization, delusions  Interventions implemented related to continuation of hospitalization: Medication monitoring and adjustment, safety checks Q15 min., suicide risk assessment, group therapy, psychoeducation, collateral contact, aftercare planning, ongoing physician assessments, medication education   Additional comments: Not applicable   Estimated length of stay:  4-6 days  Discharge Plan:  Go to a group home, follow up with [facility]  New goal(s): Not applicable   Review of initial/current patient goals per problem list:  1. Goal(s): Medication stabilization  Met: No Target date: By Discharge  As evidenced by: Ongoing medication changes  2. Goal(s): Develop plan to improve medication compliance  Met: No Target date: By Discharge  As evidenced by: Is being evaluated by [facility]RR for admission to group home  3. Goal(s): Deny HI for 48 hours prior to D/C.  Met:  No Target date: By Discharge  As evidenced by:  Denies today to  nurse, but still agitated, was homicidal yesterday and refuses to answer today to doctor  4. Goal(s): Reduce psychosis to baseline.  Met: No Target date: By Discharge  As evidenced by: Still psychotic  5. Goal(s): Determine placement at discharge, i.e. Group home.  Met: No Target date: By Discharge  As evidenced by:  Is being evaluated for PASRR, interview will be arranged with group home.  Is opposed at this time to placement, but also does not like living at home.  Attendees:  Patient: [name]  10/27/2011 10:15AM-11:15AM   Family:    Physician: Dr. Nelly Rout  10/27/2011 10:15AM-11:15AM   Nursing: Nestor Ramp, RN  10/27/2011 10:15AM -11:15AM   Case Manager: Ambrose Mantle, LCSW  10/27/2011 10:15AM-11:15AM   Counselor: Veto Kemps, MT-BC  10/27/2011 10:15AM-11:15AM   Other:    Other:    Other:    Other:    Scribe for Treatment Team:  Sarina Ser, 10/27/2011, 10:15AM-11:15AM

## 2011-10-27 NOTE — Progress Notes (Signed)
[facility] Group Notes:  (Counselor/Nursing/MHT/Case Management/Adjunct)  10/27/2011 11:44 AM  Type of Therapy:  Group Therapy  Participation Level:  Active  Participation Quality:  Attentive  Affect:  Appropriate  Cognitive:  Confused  Insight:  Limited  Engagement in Group:  Limited  Engagement in Therapy:  Limited  Modes of Intervention:  Problem-solving  Summary of Progress/Problems: [name] presented with incomplete ideas and irrational thoughts that did not fit with his life changes.  [name] expressed he was at the beginning of learning how to get where he wants to be in life, but not wanting to listen to what other people want him to do because he gets lost in making sure nothing happens to other people.   Clarice Pole C 10/27/2011, 11:44 AM

## 2011-10-27 NOTE — Discharge Planning (Signed)
Aftercare Planning Group  Date:  10/27/2011  Time:  8:30AM  Narrative:  Met with patient in Aftercare Planning Group and provided today's workbook based on theme of the day.  Patient stated he really does not want to go to a group home and does not understand why he cannot go back home.  Case Manager reminded him that his mother and grandmother can no longer really provide him with the help he needs, and that going to a group home will be a way for him to start focusing on his own goals, on his own life.  He went on to talk at length about feeling that he does not need to take medication for a mental illness, in fact nobody needs to take medication for mental illness or even for something like diabetes.  It is a question of using one's mind to overcome, he stated, such as going to bed at the correct time, eating the correct foods.   PASRR staff is coming to interview patient today, and patient was made aware of this.  Affect:  Blunted  Insight:  Poor  Judgment:  Poor  Problems identified:  Patient needs to talk to his ACT Team again for them to explain to him more about the reasons he is being referred to a family care home.  [facility] should not come interview patient until he is less psychotic.  Request(s) made to Case Manager:   None   Still needed to finalize discharge plans:  Call [facility] to come visit again. Send FL-2 to [facility].  Facilitate [facility] staff visit.  Arrange for [facility] staff to visit patient next week.     Ambrose Mantle, LCSW 10/27/2011 11:26 AM

## 2011-10-27 NOTE — Progress Notes (Signed)
[facility] Group Notes:  (Counselor/Nursing/MHT/Case Management/Adjunct)  10/27/2011 2:20 PM  Type of Therapy:  Music Therapy  Participation Level:  Active  Participation Quality:  Attentive  Affect:  Appropriate  Cognitive:  Oriented  Insight:  Limited  Engagement in Group:  Good  Engagement in Therapy:  Good  Modes of Intervention:  Activity, Socialization, Support and music  Summary of Progress/Problems: Patient participated in music therapy activities which focused on music as leisure. He picked out songs he liked and sang songs that were played on the keyboard by therapist.   Veto Kemps 10/27/2011, 2:20 PM

## 2011-10-27 NOTE — Progress Notes (Signed)
D: Patient denies SI or HI. Patient appears to have a depressed mood and affect. Patient did not want to come up to take his medications this morning due to tiredness. Patient says he is feeling a "little better" than he has been and reports sleeping well and having a good appetite and energy level.  A: Patient given emotional support from RN and told to report any feelings of SI. Patient encouraged to come to staff with concerns and/or questions. Patient's medication routine continued. Patient's orders and plan of care reviewed. Medications were taken to patient's room with the agreement that he would come to the medication window in the future. R: Patient remains appropriate and cooperative. Will continue to monitor patient q15 minutes for safety. Patient agrees to come to RN if he needs anything.

## 2011-10-27 NOTE — Progress Notes (Signed)
Patient ID: [name], male   DOB: 1991-02-28, 20 y.o.   MRN: [id number] [facility] MD Progress Note  10/27/2011 12:13 PM  Diagnosis:  Axis I: Schizoaffective disorder, bipolar type  ADL's:  Impaired  Sleep: Fair  Appetite:  Fair  Suicidal Ideation:  Plan:  None Homicidal Ideation:  Plan:  Patient was homicidal towards mother and grandmother  AEB (as evidenced by):  Mental Status Examination/Evaluation: Objective:  Appearance: Disheveled  Patent attorney::  Fair  Speech:  Blocked  Volume:  Normal  Mood:  Depressed, Dysphoric and Irritable  Affect:  Blunt and Congruent  Thought Process:  Disorganized and Loose  Orientation:  Other:  Place and person  Thought Content:  Delusions and Hallucinations: None per patient but he seems to be responding to internal stimuli   Suicidal Thoughts:  No  Homicidal Thoughts:  Yes.  with intent/plan to harm his mother and grandmother   Memory:  Immediate;   Fair  Judgement:  Poor  Insight:  Shallow  Psychomotor Activity:  Mannerisms  Concentration:  Poor  Recall:  Poor  Akathisia:  No    AIMS (if indicated):     Assets:  Housing Physical Health Social Support  Sleep:  Number of Hours: 3    Vital Signs:Blood pressure 138/82, pulse 72, temperature 98.2 F (36.8 C), temperature source Oral, resp. rate 16, height 5\' 7"  (1.702 m), weight 189 lb (85.73 kg). Current Medications: Current Facility-Administered Medications  Medication Dose Route Frequency Provider Last Rate Last Dose  . acetaminophen (TYLENOL) tablet 650 mg  650 mg Oral Q6H PRN Mickeal Skinner, MD      . alum & mag hydroxide-simeth (MAALOX/MYLANTA) 200-200-20 MG/5ML suspension 30 mL  30 mL Oral Q4H PRN Mickeal Skinner, MD      . benztropine (COGENTIN) tablet 0.5 mg  0.5 mg Oral BID PRN Mickeal Skinner, MD      . lithium carbonate capsule 600 mg  600 mg Oral BID WC Verne Spurr, PA-C   600 mg at 10/27/11 0800  . LORazepam (ATIVAN) tablet 1 mg  1 mg Oral Q6H PRN Mickeal Skinner, MD      .  magnesium hydroxide (MILK OF MAGNESIA) suspension 30 mL  30 mL Oral Daily PRN Mickeal Skinner, MD      . nicotine (NICODERM CQ - dosed in mg/24 hours) patch 21 mg  21 mg Transdermal Q0600 Mickeal Skinner, MD   21 mg at 10/27/11 0801  . risperiDONE (RISPERDAL) tablet 4 mg  4 mg Oral QHS Verne Spurr, PA-C   4 mg at 10/26/11 2219    Lab Results:  Results for orders placed during the hospital encounter of 10/25/11 (from the past 48 hour(s))  LITHIUM LEVEL     Status: Abnormal   Collection Time   10/25/11  8:26 PM      Component Value Range Comment   Lithium Lvl 0.31 (*) 0.80 - 1.40 mEq/L    on the patient's laboratory findings were within normal limits which include a comprehensive metabolic panel, urinalysis and a urine toxicology screen  Physical Findings: Review of systems: Patient's gaze and gait is intact. Patient denies any chest pain, shortness of breath, wheezing, any palpitation. Patient denies any abdominal pain, nausea, vomiting. Patient also denies any dysuria AIMS: Facial and Oral Movements Muscles of Facial Expression: None, normal Lips and Perioral Area: None, normal Jaw: None, normal Tongue: None, normal,Extremity Movements Upper (arms, wrists, hands, fingers): None, normal Lower (legs, knees, ankles, toes): None, normal, Trunk Movements Neck, shoulders, hips:  None, normal, Overall Severity Severity of abnormal movements (highest score from questions above): None, normal Incapacitation due to abnormal movements: None, normal Patient's awareness of abnormal movements (rate only patient's report): No Awareness, Dental Status Current problems with teeth and/or dentures?: No Does patient usually wear dentures?: No  CIWA:    COWS:     Treatment Plan Summary: Daily contact with patient to assess and evaluate symptoms and progress in treatment Medication management Patient is currently psychotic , in order to for the patient to be discharged he psychosis needs to remit Patient  also needs education in regards to his diagnosis treatment and the need for medication compliance  Plan: Patient on Risperdal Consta injection 37.5 mg IM. Will continue patient on risperidone 4 mg at bedtime for psychosis Will continue lithium carbonate 600 mg twice daily for mood stabilization Mood repeat lithium level on Monday as the level on 7/23 was 0.31 which is low. Patient is also drinking a lot of water Patient met with the PASRR worker today in order to get approval for a group home. Patient says that he does not want to go to group home and adds that it is his mother and grandmother who are the problem Patient to participate in groups  [facility] 10/27/2011, 12:13 PM

## 2011-10-27 NOTE — Progress Notes (Signed)
Patient ID: [name], male   DOB: 10-Dec-1990, 20 y.o.   MRN: [id number] D: Pt. Reports day "alright", interaction brief, pt. Appears restless and suspicious. Pt. Does got to [employer] and dances. A: Staff monitor's q61min for safety. Staff clapped and encouraged pt. Participation at [employer].  R: Pt. Smiled seemed to enjoy karaoke. Pt. Remains safe on the unit.

## 2011-10-28 NOTE — Progress Notes (Signed)
Psychoeducational Group Note  Date:  10/27/2011 Time:  1100  Group Topic/Focus:  Overcoming Stress:   The focus of this group is to define stress and help patients assess their triggers.  Participation Level:  Minimal  Participation Quality:  Appropriate  Affect:  Appropriate  Cognitive:  Appropriate  Insight:  Limited  Engagement in Group:  Limited  Additional Comments:  Pt attempted to respond to questions asked during group, but showed signs of thought blocking stimuli. Answers were appropriate but responses were labored.   Mirenda Baltazar A 10/28/2011, 12:01 AM

## 2011-10-28 NOTE — Progress Notes (Signed)
Patient ID: [name], male   DOB: 12/30/1990, 20 y.o.   MRN: [id number] D. The patient has a flat affect and depressed mood. Minimal interaction in the milieu. A. Encouraged to attend group. Medication administered as prescribed. R. Attended group with minimal participation. Stated he felt it was a good day because he didn't hurt any one and that he is here for anger management. Waiting placement in a group home.

## 2011-10-28 NOTE — Progress Notes (Signed)
[facility] Group Notes:  (Counselor/Nursing/MHT/Case Management/Adjunct)  10/28/2011 9:38 PM  Type of Therapy:  wrap up  Participation Level:  Active  Participation Quality:  Appropriate  Affect:  Appropriate  Cognitive:  Appropriate  Insight:  Good  Engagement in Group:  Good  Engagement in Therapy:  Good  Modes of Intervention:  Education and Support  Summary of Progress/Problems: [name] was active in group tonight. He said his goal was to get exercise today and he met that goal. He said his positive thing today was not going off on anyone. He wants to talk to his nurse about his expected discharge day.   Nichola Sizer 10/28/2011, 9:38 PMThe focus of this group is to help patients review their daily goal of treatment and discuss progress on daily workbooks.

## 2011-10-28 NOTE — Discharge Planning (Signed)
Aftercare Planning Group   -  10/28/2011 @ 8:30AM  Narrative:  Patient did not attend Aftercare Planning Group so Case Manager went to his room and provided today's workbook based on theme of the day.  Patient was asleep.  Case Manager facilitated PASRR visit yesterday, and was told by the interviewer that patient will be approved to go to a group home.  Looked up on system, and PASRR number is not yet in system.  Case Manager updated FL-2 and sent to [facility].    Problems identified:  None  Request(s) made to Case Manager:  None  Still needed to finalize discharge plans:  Arrange for [facility] staff to visit patient next week.  Get appointment with [facility].   Ambrose Mantle, LCSW 10/28/2011 10:07 AM

## 2011-10-28 NOTE — Progress Notes (Signed)
[facility] Group Notes:  (Counselor/Nursing/MHT/Case Management/Adjunct)  10/28/2011 3:27 PM  Type of Therapy:  Group Therapy  Participation Level:  Did Not Attend     [name] 10/28/2011, 3:27 PM

## 2011-10-28 NOTE — Progress Notes (Signed)
10/28/2011      Time: 0930      Group Topic/Focus: The focus of this group is on discussing various styles of communication and communicating assertively using 'I' (feeling) statements.  Participation Level: Did not attend  Participation Quality: Not Applicable  Affect: Not Applicable  Cognitive: Not Applicable   Additional Comments: Patient in bed sleeping, didn't respond to requests to attend group.  Adrianna Dudas 10/28/2011 10:19 AM

## 2011-10-28 NOTE — Progress Notes (Addendum)
D Crit is seen at the med window this AM...he avoids eye contact. He is paranoid.guarded.Irritable when this nurse asks him how he slept. He does not answer this nurse's questions. He nods down at the floor when this nurse asks him to complete his AM self inventory.\       A Pt returned back to his room after taking AM meds.       R  Safety is maintained and POC includes continuing with therapeutic relationship PD RN [facility]      10/28/2011 1615  Pt has not completed self inventory , as requested , today. He remains avoidant.irritale.paranoid and guarded. PD RN [facility]

## 2011-10-29 MED ORDER — LITHIUM CARBONATE 300 MG PO CAPS
900.0000 mg | ORAL_CAPSULE | Freq: Every day | ORAL | Status: DC
  Administered 2011-10-29 – 2011-10-31 (×3): 900 mg via ORAL
  Filled 2011-10-29 (×6): qty 3

## 2011-10-29 MED ORDER — LITHIUM CARBONATE 300 MG PO CAPS
300.0000 mg | ORAL_CAPSULE | ORAL | Status: DC
  Administered 2011-10-29 – 2011-11-01 (×4): 300 mg via ORAL
  Filled 2011-10-29: qty 1
  Filled 2011-10-29: qty 56
  Filled 2011-10-29 (×4): qty 1

## 2011-10-29 NOTE — Progress Notes (Signed)
D Arnaldo is seen OOB UAL on the 400 hall this morning..he remains paranoid and guarded. He is quiet, not responding to this nurse when he is spoken to.  He demonstrates limited insight into his illness and his related issues.      A He is compliant with his medications. HE does not complete his AM self inventory sheet as requested by this nurse.       R POC remains in place, safety is maintained and therapeutic relationship continues PD RN [facility]

## 2011-10-29 NOTE — Progress Notes (Signed)
Patient ID: [name], male   DOB: [birth year], 20 y.o.   MRN: [id number] D. The patient was depressed and irritable this evening. Stated that he tried to call his family today, but no one from home would speak to him. Stated he usually exercises to relieve stress, but was unable to go outside today so he was feeling tense.  A. Q 15 minute checks maintained for safety. Encouraged to explore alternative ways to handle stress when exercising isn't an op[tion. H.S. medications administered. R. Was not receptive to exploring new ways to handle stress. Compliant with medication.

## 2011-10-29 NOTE — Progress Notes (Signed)
Patient ID: [name], male   DOB: [birth year], 20 y.o.   MRN: [id number]  [facility] Group Notes:  (Counselor/Nursing/MHT/Case Management/Adjunct)  10/29/2011 11 AM  Type of Therapy:  Aftercare Planning, Group Therapy, Dance/Movement Therapy   Participation Level:  Minimal  Participation Quality:  Appropriate and Inattentive  Affect:  Flat  Cognitive:  Oriented  Insight:  Limited  Engagement in Group:  Limited  Engagement in Therapy:  Limited  Modes of Intervention:  Clarification, Problem-solving, Role-play, Socialization and Support  Summary of Progress/Problems: After Care: Pt. attended and participated in aftercare planning group. Pt. accepted information on suicide prevention, warning signs to look for with suicide and crisis line numbers to use. The pt. agreed to call crisis line numbers if having warning signs or having thoughts of suicide. Pt. listed their current mood as feeling good like the color blue. Counseling: Therapist discussed the definition of self sabotage.Therapist asked group "What is your definition of self sabotage.?" and "Why do we tend to do things to sabotage ourselves?" Therapist discussed motives of sabotage and ways to turn negative thoughts into positive thoughts.  Therapist asked group to discuss self sabotaging mechanisms in their lives and how they turn the negative into positive.  Pt. stated, "Listen to music".     [name] 10/29/2011. 12:00 PM

## 2011-10-29 NOTE — Progress Notes (Signed)
Patient ID: [name], male   DOB: March 13, 1991, 20 y.o.   MRN: [id number] D. The patient was unable to sleep. Tossing and turning and pacing in hallway. Denies any A/V hallucinations. Stated he couldn't relax. A. Medicated for anxiety. R. Settled for sleep. Will continue to monitor.

## 2011-10-29 NOTE — Progress Notes (Addendum)
[name]  20 y.o.  [id number] 08-May-1990  10/28/2011   Diagnosis:   Subjective: [name] has been in bed most of the morning.  He appears sedated and sleepy.  However upon discussion of his symptoms the patient loudly and deliberately passes gas then gages my reaction. He is asked not to do that in my presence again. He then grins and states he "had to do it."  Vital Signs:Blood pressure 104/67, pulse 59, temperature 98.4 F (36.9 C), temperature source Oral, resp. rate 16, height 5\' 7"  (1.702 m), weight 85.73 kg (189 lb).   Objective: [name] states he slept "alright" and is eating well. He is still responding to internal stimulation.  He says he has conversations with himself.  Assessment: schizophrenia  Medications Scheduled:  . lithium carbonate  600 mg Oral BID WC  . nicotine  21 mg Transdermal Q0600  . risperiDONE  4 mg Oral QHS     PRN Meds acetaminophen, alum & mag hydroxide-simeth, benztropine, LORazepam, magnesium hydroxide  Plan: Continue current plan of care with the following changes. 1. Lithium 300mg  q AM with breakfast. 2 LIthium  900mg  q hs. 3. Continue Risperdal 4mg  at his.  Rona Ravens. Alverta Caccamo [facility] 10/29/2011 12:57 AM

## 2011-10-29 NOTE — Progress Notes (Signed)
Psychoeducational Group Note  Date:  10/29/2011 Time:  0945 am  Group Topic/Focus:  Identifying Needs:   The focus of this group is to help patients identify their personal needs that have been historically problematic and identify healthy behaviors to address their needs.  Participation Level:  Did Not Attend    Andrena Mews 10/29/2011,10:38 AM

## 2011-10-30 NOTE — Progress Notes (Signed)
Patient ID: [name], male   DOB: 1990-11-13, 20 y.o.   MRN: [id number]  [facility] Group Notes:  (Counselor/Nursing/MHT/Case Management/Adjunct)  10/30/2011 11 AM  Type of Therapy:  Aftercare Planning, Group Therapy, Dance/Movement Therapy   Participation Level:  Did Not Attend   [name] 10/30/2011. 12:06 PM

## 2011-10-30 NOTE — Progress Notes (Signed)
Prashant Glosser  21 y.o.  [id number] Dec 13, 1990  10/29/2011  Diagnosis:  Schizoaffective disorder  Subjective: Met with patient to discuss his symptoms.  He states he slept better last night.  Reports no new symptoms.  He states his appetite is average. Denies SI/HI states no AH/VH. Reports he is hopeful for the future.  Vital Signs:Blood pressure 124/74, pulse 118, temperature 97.1 F (36.2 C), temperature source Oral, resp. rate 17, height 5\' 7"  (1.702 m), weight 85.73 kg (189 lb).   Objective: Dequane looks sedated and lethargic. His eye contact is fair, speech is clear, but minimal. States normal thought process and content, but does not seem sincere,  Medications Scheduled:  . lithium carbonate  300 mg Oral BH-q7a  . lithium carbonate  900 mg Oral QHS  . nicotine  21 mg Transdermal Q0600  . risperiDONE  4 mg Oral QHS     PRN Meds acetaminophen, alum & mag hydroxide-simeth, benztropine, LORazepam, magnesium hydroxide  Plan: Continue current plan of care with no changes at this time.  Anticipated D/C is possibly Monday. Waiting on PASSAR number.  Rona Ravens. Chirag Krueger [facility] 10/30/2011 2:16 PM

## 2011-10-30 NOTE — Progress Notes (Signed)
Psychoeducational Group Note  Date:  10/30/2011 Time:  1515  Group Topic/Focus:  Goals Group:   The focus of this group is to help patients establish daily goals to achieve during treatment and discuss how the patient can incorporate goal setting into their daily lives to aide in recovery.  Participation Level:  Minimal  Participation Quality:  Appropriate  Affect:  Appropriate  Cognitive:  Appropriate  Insight:  Good  Engagement in Group:  Good  Additional Comments:  Pt participated and processed in group.  Marquis Lunch, Dashia Caldeira 10/30/2011, 4:29 PM

## 2011-10-30 NOTE — Progress Notes (Signed)
[facility] Group Notes:  (Counselor/Nursing/MHT/Case Management/Adjunct)  10/30/2011 1:57 AM  Type of Therapy:  wrap-up  Participation Level:  Minimal  Participation Quality:  Appropriate  Affect:  Flat  Cognitive:  Appropriate  Insight:  Good  Engagement in Group:  Good  Engagement in Therapy:  Good  Modes of Intervention:  Education  Summary of Progress/Problems:Patient attended and participated in group this evening. He reports that her watch television and attended his groups today. He reports that when he is stressed he play sport   Scot Dock 10/30/2011, 1:57 AM

## 2011-10-30 NOTE — Progress Notes (Signed)
[facility] Group Notes:  (Counselor/Nursing/MHT/Case Management/Adjunct)  10/30/2011 8:00PM Type of Therapy:  Group Therapy  Participation Level:  Minimal  Participation Quality:  Appropriate  Affect:  Flat  Cognitive:  Alert and Oriented  Insight:  Limited  Engagement in Group:  Limited  Engagement in Therapy:  Limited  Modes of Intervention:  Problem-solving and Support  Summary of Progress/Problems: Pt stated that he had a good day on today. Pt verbalized that he was able to get out of his room more. Pt verbalized that he was also able to attend most of his groups.  Gatlin Kittell, Ollivander See 10/30/2011, 8:57 PM

## 2011-10-30 NOTE — Progress Notes (Signed)
D Rhaeem has remained guarded...kept to himself, not choosing to speak with this nurse today. HE is seen occasionally. in the milieu. But he avoids eye contact when this nurse approaches him and tries to initiate conversation. He denies SI, he does not complete his self inventory, but he is encouraged to do so by this Clinical research associate.      A He is flat, paranoid and guarded.      R POC is in place, safety is maintained and therapeutic relationship is fostered PD RN [facility]

## 2011-10-30 NOTE — Progress Notes (Signed)
Psychoeducational Group Note  Date:  10/30/2011 Time:  0945 am  Group Topic/Focus:  Making Healthy Choices:   The focus of this group is to help patients identify negative/unhealthy choices they were using prior to admission and identify positive/healthier coping strategies to replace them upon discharge.  Participation Level:  Did Not Attend  [name] J 10/30/2011, 10:29 AM  

## 2011-10-30 NOTE — Progress Notes (Signed)
[facility] MD Progress Note  10/30/2011 12:06 PM  S: Too sleepy to go to group this morning. I am doing fine My mood is okay. I am not hallucinating if you want to know".  O: Patient was lying in bed with eye closed during this follow-up assessment. Opens eyes on command.Did not try to make eye contact. Tries to answer questions without listening, hearing the end of the questions.    Diagnosis:  Axis I: Schizoaffective disorder, bipolar type Axis II: Deferred Axis III: History reviewed. No pertinent past medical history. Axis IV: other psychosocial or environmental problems Axis V: 41-50 serious symptoms  ADL's:  Intact  Sleep: Good  Appetite:  Good  Suicidal Ideation:  Plan:  No Intent:  No Means:  No Homicidal Ideation:  Plan:  No Intent:  No Means:  No  AEB (as evidenced by): per patient.  Mental Status Examination/Evaluation: Objective:  Appearance: Casual  Eye Contact::  Fair  Speech:  Clear and Coherent  Volume:  Normal  Mood:  "I'm doing fine"  Affect:  Flat  Thought Process:  Coherent  Orientation:  Full  Thought Content:  Rumination  Suicidal Thoughts:  No  Homicidal Thoughts:  No  Memory:  Immediate;   Good Recent;   Good Remote;   Fair  Judgement:  Impaired  Insight:  Lacking  Psychomotor Activity:  Normal  Concentration:  Fair  Recall:  Good  Akathisia:  No  Handed:  Right  AIMS (if indicated):     Assets:  Desire for Improvement  Sleep:  Number of Hours: 3.75    Vital Signs:Blood pressure 124/74, pulse 118, temperature 97.1 F (36.2 C), temperature source Oral, resp. rate 17, height 5\' 7"  (1.702 m), weight 85.73 kg (189 lb). Current Medications: Current Facility-Administered Medications  Medication Dose Route Frequency Provider Last Rate Last Dose  . acetaminophen (TYLENOL) tablet 650 mg  650 mg Oral Q6H PRN Mickeal Skinner, MD      . alum & mag hydroxide-simeth (MAALOX/MYLANTA) 200-200-20 MG/5ML suspension 30 mL  30 mL Oral Q4H PRN Mickeal Skinner, MD      . benztropine (COGENTIN) tablet 0.5 mg  0.5 mg Oral BID PRN Mickeal Skinner, MD      . lithium carbonate capsule 300 mg  300 mg Oral BH-q7a Verne Spurr, PA-C   300 mg at 10/30/11 0729  . lithium carbonate capsule 900 mg  900 mg Oral QHS Verne Spurr, PA-C   900 mg at 10/29/11 2201  . LORazepam (ATIVAN) tablet 1 mg  1 mg Oral Q6H PRN Mickeal Skinner, MD   1 mg at 10/30/11 0400  . magnesium hydroxide (MILK OF MAGNESIA) suspension 30 mL  30 mL Oral Daily PRN Mickeal Skinner, MD      . nicotine (NICODERM CQ - dosed in mg/24 hours) patch 21 mg  21 mg Transdermal Q0600 Mickeal Skinner, MD   21 mg at 10/30/11 0725  . risperiDONE (RISPERDAL) tablet 4 mg  4 mg Oral QHS Verne Spurr, PA-C   4 mg at 10/29/11 2201    Lab Results: No results found for this or any previous visit (from the past 48 hour(s)).  Physical Findings: AIMS: Facial and Oral Movements Muscles of Facial Expression: None, normal Lips and Perioral Area: None, normal Jaw: None, normal Tongue: None, normal,Extremity Movements Upper (arms, wrists, hands, fingers): None, normal Lower (legs, knees, ankles, toes): None, normal, Trunk Movements Neck, shoulders, hips: None, normal, Overall Severity Severity of abnormal movements (highest score from questions above): None,  normal Incapacitation due to abnormal movements: None, normal Patient's awareness of abnormal movements (rate only patient's report): No Awareness, Dental Status Current problems with teeth and/or dentures?: No Does patient usually wear dentures?: No  CIWA:  CIWA-Ar Total: 8  COWS:     Treatment Plan Summary: Daily contact with patient to assess and evaluate symptoms and progress in treatment Medication management  Plan: Continue current treatment plan. Lithium levels to be drawn 10/31/11.  Armandina Stammer I 10/30/2011, 12:06 PM

## 2011-10-31 LAB — LITHIUM LEVEL: Lithium Lvl: 1.05 mEq/L (ref 0.80–1.40)

## 2011-10-31 MED ORDER — CITALOPRAM HYDROBROMIDE 20 MG PO TABS
20.0000 mg | ORAL_TABLET | Freq: Every day | ORAL | Status: DC
  Administered 2011-10-31 – 2011-11-01 (×2): 20 mg via ORAL
  Filled 2011-10-31: qty 14
  Filled 2011-10-31 (×5): qty 1

## 2011-10-31 NOTE — Treatment Plan (Signed)
Interdisciplinary Treatment Plan Update (Adult) Date: 10/31/2011  Time Reviewed: 4:21 PM  Progress in Treatment: Attending groups: Yes Participating in groups: Yes Taking medication as prescribed: Yes Tolerating medication: Yes Family/Significant othe contact made:  Patient understands diagnosis: Yes Discussing patient identified problems/goals with staff: Yes Medical problems stabilized or resolved: Yes Denies suicidal/homicidal ideation: Yes Issues/concerns per patient self-inventory: None identified Other: N/A New problem(s) identified: None Identified Reason for Continuation of Hospitalization: Hallucinations Medication stabilization Interventions implemented related to continuation of hospitalization: mood stabilization, medication monitoring and adjustment, group therapy and psycho education, safety checks q 15 mins Additional comments: N/A Estimated length of stay:  Discharge Plan: SW is assessing for appropriate referrals.  New goal(s): N/A Review of initial/current patient goals per problem list:  Discharge Plan: Go to a group home, follow up with [facility]  New goal(s): Not applicable  Review of initial/current patient goals per problem list:  1. Goal(s): Medication stabilization  Met: No  Target date: By Discharge  As evidenced by: Ongoing medication changes  2. Goal(s): Develop plan to improve medication compliance  Met: Yes  Target date: By Discharge  As evidenced by: Has been evaluated by [facility] for admission to group home, has been accepted by [facility]  3. Goal(s): Deny HI for 48 hours prior to D/C.  Met: Yes  Target date: By Discharge  As evidenced by: Denies today to treatment team, but still uncertain about why he has to go to grouphome after interview and recommendations being previously explained by ACTT team and case manager 4. Goal(s): Reduce psychosis to baseline.  Met: No  Target date: By Discharge  As evidenced by: Still  psychotic 5. Goal(s): Determine placement at discharge, i.e. Group home.  Met: No  Target date: By Discharge  As evidenced by: Interview arranged for [facility] to come in and meet with Pt on 11-01-2011 at 2pm.  Pt. is opposed at this time to placement, but also does not like living at home.  Attendees: Patient: [name] 10/31/2011  Family:    Physician: Franchot Gallo, MD 10/31/2011 4:21 PM   Nursing: Alexia Freestone, RN   Case Manager: Clarice Pole, LCASA 10/31/2011 4:21 PM   Counselor: Veto Kemps, MT-BC 10/31/2011 4:21 PM   Other:    Other:    Other:    Other:    Scribe for Treatment Team:  Clarice Pole, LCASA 10/31/2011 4:21 PM

## 2011-10-31 NOTE — Progress Notes (Signed)
Psychoeducational Group Note  Date:  10/31/2011 Time:  1100  Group Topic/Focus:  Self Care:   The focus of this group is to help patients understand the importance of self-care in order to improve or restore emotional, physical, spiritual, interpersonal, and financial health.  Participation Level:  Minimal  Participation Quality:  Resistant  Affect:  Flat  Cognitive:  Appropriate  Insight:  Good  Engagement in Group:  Good  Additional Comments:  Pt was very quiet, shared very little.  Isla Pence M 10/31/2011, 1:29 PM

## 2011-10-31 NOTE — Progress Notes (Signed)
Patient ID: [name], male   DOB: October 11, 1990, 20 y.o.   MRN: [id number] D: Pt. In room sits up in bed to speak with writer, makes minimum eye contact, suspicious. Pt. Denies SHI. A Staff will monitor q3min for safety.Writer reviewed meds, Pt. Encouraged to attend group. R: Pt. Reports no problems with meds, no questions at this time.  Pt. Remains safe on the unit. Pt. Attends group.

## 2011-10-31 NOTE — Progress Notes (Signed)
[facility] MD Progress Note  10/31/2011 5:40 PM  Current Mental Status Per Physician:   Diagnosis:  Axis I: Schizoaffective Disorder - Bipolar Type.   The patient was seen today and reports the following:   ADL's: Intact.  Sleep: The patient reports that he is having difficulty sleeping at night.  Appetite: The patient reports a good appetite this morning.   Mild>(1-10) >Severe  Hopelessness (1-10): 3-4  Depression (1-10): 7  Anxiety (1-10): 0   Suicidal Ideation: The patient denies any suicidal ideations today.  Plan: No  Intent: No  Means: No   Homicidal Ideation: The patient denies any homicidal ideations today.  Plan: No  Intent: No.  Means: No   General Appearance/Behavior: The patient was minimally cooperative today with this provider.  Eye Contact: Fair.  Speech: Appropriate in rate and volume with no pressuring noted today.  However there was very little spontaneous speech today.  Motor Behavior: wnl.  Level of Consciousness: Alert and Oriented x 3.  Mental Status: Alert and Oriented x 3.  Mood: Moderately Depressed.  Affect: Appears moderately constricted.  Anxiety Level: No anxiety reported today.  Thought Process: wnl.  Thought Content: The patient denies any auditory or visual hallucinations today as well as any delusional thinking.  Perception: wnl.  Judgment: Fair.  Insight: Fair.  Cognition: Oriented to person, place and time.  Sleep:  Number of Hours: 5.5    Vital Signs:Blood pressure 108/70, pulse 116, temperature 97.7 F (36.5 C), temperature source Oral, resp. rate 18, height 5\' 7"  (1.702 m), weight 85.73 kg (189 lb).  Current Medications: Current Facility-Administered Medications  Medication Dose Route Frequency Provider Last Rate Last Dose  . acetaminophen (TYLENOL) tablet 650 mg  650 mg Oral Q6H PRN Mickeal Skinner, MD      . alum & mag hydroxide-simeth (MAALOX/MYLANTA) 200-200-20 MG/5ML suspension 30 mL  30 mL Oral Q4H PRN Mickeal Skinner, MD      .  benztropine (COGENTIN) tablet 0.5 mg  0.5 mg Oral BID PRN Mickeal Skinner, MD      . lithium carbonate capsule 300 mg  300 mg Oral BH-q7a Verne Spurr, PA-C   300 mg at 10/31/11 1610  . lithium carbonate capsule 900 mg  900 mg Oral QHS Verne Spurr, PA-C   900 mg at 10/30/11 2151  . LORazepam (ATIVAN) tablet 1 mg  1 mg Oral Q6H PRN Mickeal Skinner, MD   1 mg at 10/31/11 0205  . magnesium hydroxide (MILK OF MAGNESIA) suspension 30 mL  30 mL Oral Daily PRN Mickeal Skinner, MD      . nicotine (NICODERM CQ - dosed in mg/24 hours) patch 21 mg  21 mg Transdermal Q0600 Mickeal Skinner, MD   21 mg at 10/31/11 0640  . risperiDONE (RISPERDAL) tablet 4 mg  4 mg Oral QHS Verne Spurr, PA-C   4 mg at 10/30/11 2152   Lab Results:  Results for orders placed during the hospital encounter of 10/25/11 (from the past 48 hour(s))  LITHIUM LEVEL     Status: Normal   Collection Time   10/31/11  6:40 AM      Component Value Range Comment   Lithium Lvl 1.05  0.80 - 1.40 mEq/L    Physical Findings: AIMS: Facial and Oral Movements Muscles of Facial Expression: None, normal Lips and Perioral Area: None, normal Jaw: None, normal Tongue: None, normal,Extremity Movements Upper (arms, wrists, hands, fingers): None, normal Lower (legs, knees, ankles, toes): None, normal, Trunk Movements Neck, shoulders, hips: None, normal,  Overall Severity Severity of abnormal movements (highest score from questions above): None, normal Incapacitation due to abnormal movements: None, normal Patient's awareness of abnormal movements (rate only patient's report): No Awareness, Dental Status Current problems with teeth and/or dentures?: No Does patient usually wear dentures?: No  CIWA:  CIWA-Ar Total: 8  COWS:     Review of Systems:  Neurological: The patient denies any headaches today. He denies any seizures or dizziness.  G.I.: The patient denies any constipation or G.I. Upset today.  Musculoskeletal: The patient denies any  musculoskeletal issues today.   The patient was seen today and is Alert and Oriented x 3. The patient reports that he is having difficulty initiating and maintaining sleep but reports a good appetite. He reports moderate feelings of sadness, anhedonia and depressed mood but denies any anxiety symptoms.  He denies any suicidal or homicidal ideations today and also denies any auditory or visual hallucinations or delusional thinking. The patient denies any medication related side effects or other concerns today.  Treatment Plan Summary:  1. Daily contact with patient to assess and evaluate symptoms and progress in treatment.  2. Medication management  3. The patient will deny suicidal ideations or homicidal ideations for 48 hours prior to discharge and have a depression and anxiety rating of 3 or less. The patient will also deny any auditory or visual hallucinations or delusional thinking.  4. The patient will deny any symptoms of substance withdrawal at time of discharge.   Plan:  1. Will continue the patient on his current medications as listed above.  2. Will start the patient on the medication Celexa at 20 mgs po q am for depression and anxiety. 3. Will continue to monitor.  4. Laboratory studies reviewed.  5. Will make the patient a No Roommate due to aggressive behaviors.  RANDY READLING 10/31/2011, 5:40 PM

## 2011-10-31 NOTE — Progress Notes (Signed)
[facility] Group Notes:  (Counselor/Nursing/MHT/Case Management/Adjunct)  10/31/2011 10:04 PM  Type of Therapy:  wrap-up  Participation Level:  Minimal  Participation Quality:  Drowsy  Affect:  Flat  Cognitive:  Appropriate  Insight:  Good  Engagement in Group:  Good  Engagement in Therapy:  Good  Modes of Intervention:  Education  Summary of Progress/Problems: Patient stated that his day started off good and then got a little rocky after a phone conversation with his mom. Patient stated that he couldn't worry about that and needed to stay focused on what he needed to do for himself.   Percell Locus 10/31/2011, 10:04 PM

## 2011-10-31 NOTE — Progress Notes (Signed)
Patient ID: [name], male   DOB: 1990-12-26, 20 y.o.   MRN: [id number] D. The patient continues to be guarded and speaks only to select peers and staff. Stated he had a better day today and participated in most of his groups. Makes better eye contact when speaking. Denied hearing voices, but appears to be preoccupied and may be responding to internal stimuli. A. Encouraged to stay out of his room and bed so that he can sleep better this evening. Praised for going and participating in his groups today. Medicated with scheduled medications. R. Responds by giving a thumbs up when praise given. Compliant with medications.

## 2011-10-31 NOTE — Discharge Planning (Signed)
10/31/2011  Pt did not attend d/c planning group on this date. SW met with pt individually at this time. SW made TC to [facility] who have agreed to accept the patient to discuss coming to meet the patient on 2pm at [facility].  SW relayed information that the ACTT Team made the recommendation for group home placement for the pt.  Clarice Pole, LCASA 10/31/2011, 4:20 PM

## 2011-10-31 NOTE — Progress Notes (Signed)
D Rhaeem is seen this morning...walking down the 400 hall. HE cont to avoid eye contact. He is paranoid, guarded and isolative....choosing not to interact with both staff and patients. He  Keeps to himself. He stays mainly in his room and sleeps .      A He is encouraged to complete his self inventory and return it to staff, as well as attend his groups today.      R POC is in place, pt is compliant  And therapeutic relationship is fostered. PD RN [facility]

## 2011-10-31 NOTE — Progress Notes (Signed)
10/31/2011         Time: 0930      Group Topic/Focus: The focus of this group is on discussing various aspects of wellness, balancing those aspects and exploring ways to increase the ability to experience wellness.  Participation Level: Did not attend  Participation Quality: Not Applicable  Affect: Not Applicable  Cognitive: Not Applicable   Additional Comments: Patient in bed, ignored multiple requests to attend group.   Stellarose Cerny 10/31/2011 12:37 PM

## 2011-11-01 MED ORDER — RISPERIDONE 4 MG PO TABS: 4.0000 mg | ORAL_TABLET | Freq: Every day | ORAL | Status: DC

## 2011-11-01 MED ORDER — RISPERIDONE MICROSPHERES 37.5 MG IM SUSR: 37.5000 mg | INTRAMUSCULAR | Status: DC

## 2011-11-01 MED ORDER — CITALOPRAM HYDROBROMIDE 20 MG PO TABS: 20.0000 mg | ORAL_TABLET | Freq: Every day | ORAL | Status: DC

## 2011-11-01 MED ORDER — LITHIUM CARBONATE 300 MG PO CAPS: ORAL_CAPSULE | ORAL | Status: DC

## 2011-11-01 NOTE — Progress Notes (Signed)
Pt. About on the unit today.  She slept later this am and missed breakfast and later ate cereal and took his meds.  He remains guarded and a little suspicious.  He has been seclusive to room at times.  He denies SI/HI and A/V hallucinations.  A:  Offered support and encouragement.  Given meds as prescribed.  R:  Receptive to staff.  Says he is ready to be discharged.  Pt. to meet with group home staff today regarding his discharge.  Remains on q 15 minute checks for safety.  Will continue to follow.

## 2011-11-01 NOTE — Progress Notes (Signed)
[facility] Group Notes:  (Counselor/Nursing/MHT/Case Management/Adjunct)  11/01/2011 2:24 PM  Type of Therapy:  Group Therapy 10/01/11  Participation Level:  None  Participation Quality:  Physically present   Affect:  Flat  Cognitive:  Oriented  Insight:  Could not be assessed due to lack of interaction  Engagement in Group:  None  Engagement in Therapy:  Limited  Modes of Intervention:  Education and Support  Summary of Progress/Problems:Patient was not engaged, just physically present   Kaileen Bronkema, Aram Beecham 11/01/2011, 2:24 PM

## 2011-11-01 NOTE — Progress Notes (Signed)
Patient discharged from unit today. Pt. denies SI/HI and denies A/V hallucinations.  RN reviewed discharge follow up with him as well as discharge medications and prescriptions with voiced understanding given.  Staff from group home [facility] present and picked him up to transport him to the group home.  He was pleasant upon discharge and agreeable to group home placement.

## 2011-11-01 NOTE — Progress Notes (Signed)
[facility] Group Notes:  (Counselor/Nursing/MHT/Case Management/Adjunct)  11/01/2011 2:28 PM  Type of Therapy:  Group Therapy  Participation Level:  Active  Participation Quality:  Attentive and Sharing  Affect:  Flat  Cognitive:  Oriented  Insight:  Limited  Engagement in Group:  Limited  Engagement in Therapy:  Limited  Modes of Intervention:  Clarification, Education, Problem-solving and Support  Summary of Progress/Problems: Patient stated that he doesn't want to go to a group home. He recognized his dependency on his family but doesn't think he can live on his own. Counselor suggested that he use the group home as a transition.    [name], Aram Beecham 11/01/2011, 2:28 PM

## 2011-11-01 NOTE — Discharge Planning (Signed)
Narrative: Pt did not attend d/c planning group on this date. SW met with pt individually at this time.  SW met with pt. Regarding acceptance into [facility] group home.  Pt. Expressed reservations about going to a group home.  SW scheduled interview with [facility] for 2pm.  Pt. Agreed to meet with the staff from [facility] and consider placement at the group home.  SW contacted ACTT team member Jerilynn Som to coordinate follow-up with the ACTT team.  SW met with pt., Aundria Rud, and Calvin(speakerphone) to discuss interview for and transition to group home.   SW revised FL-2 and provided copy for discharge to facility.  Affect: Flat  Insight: Good    Pt agreed to transition to group home.   Clarice Pole, LCASA 11/01/2011, 6:32 PM

## 2011-11-01 NOTE — Progress Notes (Signed)
Psychoeducational Group Note  Date:  11/01/2011 Time:  1100  Group Topic/Focus:  Recovery Goals:   The focus of this group is to identify appropriate goals for recovery and establish a plan to achieve them.  Participation Level:  Minimal  Participation Quality:  Inattentive  Affect:  Flat  Cognitive:  Appropriate  Insight:  Limited  Engagement in Group:  Limited  Additional Comments:  Pt attended group but didn't do much participation.  Anjuli Gemmill E 11/01/2011, 1:57 PM

## 2011-11-01 NOTE — [facility] Suicide Risk Assessment (Signed)
Suicide Risk Assessment  Discharge Assessment     Demographic factors:   (UTA)  Current Mental Status Per Nursing Assessment::   On Admission:   At Discharge: The patient was seen today and is Alert and Oriented x 3. The patient reports that he is sleeping well and reports a good appetite. He denies any significant feelings of sadness, anhedonia or depressed mood. He does report some moderate anxiety symptoms related to the unknowns of going to a group home today.  He adamantly denies any suicidal or homicidal ideations and also denies any auditory or visual hallucinations or delusional thinking. The patient will meet his new Group Home staff today and will be discharged today if they are in agreement with the placement.   Current Mental Status Per Physician:   Diagnosis:  Axis I: Schizoaffective Disorder - Bipolar Type.   The patient was seen today and reports the following:   ADL's: Intact.  Sleep: The patient reports that he is sleeping well at night..  Appetite: The patient reports a good appetite this morning.   Mild>(1-10) >Severe  Hopelessness (1-10): 0  Depression (1-10): 0  Anxiety (1-10): 8   Suicidal Ideation:  The patient adamantly denies any suicidal ideations today.  Plan: No  Intent: No  Means: No   Homicidal Ideation:  The patient adamantly denies any homicidal ideations today.  Plan: No  Intent: No.  Means: No   General Appearance/Behavior: The patient was friendly and cooperative today with this provider.  Eye Contact: Good.  Speech: Appropriate in rate and volume with no pressuring noted today.  Motor Behavior: wnl.  Level of Consciousness: Alert and Oriented x 3.  Mental Status: Alert and Oriented x 3.  Mood: Essentially Euthymic.  Affect: Appears bright and full.  Anxiety Level: Moderate anxiety reported today which the patient describes as worry about the unknowns of a group home placement. Thought Process: wnl.  Thought Content: The patient denies  any auditory or visual hallucinations today as well as any delusional thinking.  Perception: wnl.  Judgment: Fair.  Insight: Fair.  Cognition: Oriented to person, place and time.   Loss Factors: Loss of placement at his Mother's home.  Historical Factors: Long history of Mental Illness.  Risk Reduction Factors:   Good assess to healthcare.  The patient is being discharged to a group home.  Continued Clinical Symptoms:  Previous Psychiatric Diagnoses and Treatments Schizoaffective Affective Disorder - Bipolar Type.  Discharge Diagnoses:   AXIS I:   Schizoaffective Disorder - Bipolar Type.  AXIS II:   Deferred. AXIS III:   None Reported. AXIS IV:   Chronic Mental Illness.  Non-compliance with medications.  Family Conflict. AXIS V:   GAF at time of admission approximately 35.  GAF at time of discharge approximately 55.  Cognitive Features That Contribute To Risk:  Polarized thinking    Current Medications:     . citalopram  20 mg Oral Daily  . lithium carbonate  300 mg Oral BH-q7a  . lithium carbonate  900 mg Oral QHS  . nicotine  21 mg Transdermal Q0600  . risperiDONE  4 mg Oral QHS   Review of Systems:  Neurological: The patient denies any headaches today. He denies any seizures or dizziness.  G.I.: The patient denies any constipation or G.I. Upset today.  Musculoskeletal: The patient denies any musculoskeletal issues today.   Treatment Plan Summary:  1. Daily contact with patient to assess and evaluate symptoms and progress in treatment.  2. Medication management  3. The patient will deny suicidal ideations or homicidal ideations for 48 hours prior to discharge and have a depression and anxiety rating of 3 or less. The patient will also deny any auditory or visual hallucinations or delusional thinking.  4. The patient will deny any symptoms of substance withdrawal at time of discharge.   The patient was seen today and is Alert and Oriented x 3. The patient reports  that he is sleeping well and reports a good appetite. He denies any significant feelings of sadness, anhedonia or depressed mood. He does report some moderate anxiety symptoms related to the unknowns of going to a group home today.  He adamantly denies any suicidal or homicidal ideations and also denies any auditory or visual hallucinations or delusional thinking. The patient will meet his new Group Home staff today and will be discharged today if they are in agreement with the placement.   Plan:  1. Will continue the patient on his current medications as listed above.  2. Will continue to monitor.  3. Laboratory studies reviewed.  4. Discharge today to outpatient follow up.   Suicide Risk:  Minimal: No identifiable suicidal ideation.  Patients presenting with no risk factors but with morbid ruminations; may be classified as minimal risk based on the severity of the depressive symptoms  Plan Of Care/Follow-up recommendations:  Activity:  As tolerated. Diet:  Regular Diet. Other:  Please take all medications only as directed and keep all scheduled follow up appointments.  Terreon Ekholm 11/01/2011, 1:36 PM

## 2011-11-01 NOTE — Treatment Plan (Signed)
Interdisciplinary Treatment Plan Update (Adult) Date: 11/01/2011  Time Reviewed: 10:23 AM  Progress in Treatment: Attending groups: Yes Participating in groups: Yes Taking medication as prescribed: Yes Tolerating medication: Yes Family/Significant othe contact made: Contact made to Calvin(ACTT team) Patient understands diagnosis: Yes Discussing patient identified problems/goals with staff: Yes Medical problems stabilized or resolved: Yes Denies suicidal/homicidal ideation: Yes Issues/concerns per patient self-inventory: None identified Other: N/A New problem(s) identified: None Identified Reason for Continuation of Hospitalization: Other; describe Pt to discharge to group home today Interventions implemented related to continuation of hospitalization: mood stabilization, medication monitoring and adjustment, group therapy and psycho education, safety checks q 15 mins Additional comments: N/A Estimated length of stay:  Discharge Plan: SW is assessing for appropriate referrals.  New goal(s): N/A Review of initial/current patient goals per problem list:  1. Goal(s): Medication stabilization  Met: Yes  Target date: By Discharge  As evidenced by: Ongoing 2. Goal(s): Reduce auditory hallucinations (with command) to baseline.  Met: Yes  Target date: By Discharge  As evidenced by: self report 3. Goal(s): Decrease depression to no greater than 3 at discharge.  Met: Yes  Target date: By Discharge  As evidenced by: Appears less depressed 4. Goal(s): Determine aftercare living situation and follow-up, whether to remain with [facility].  Met: Yes  Target date: By Discharge  As evidenced by: Will go to [facility], follow up with ACTT Team  Attendees: Patient: [name]  11/01/2011  Family:    Physician: Franchot Gallo, MD 11/01/2011 10:23 AM   Nursing:    Case Manager: Clarice Pole, LCASA 11/01/2011 10:23 AM   Counselor: Veto Kemps, MT-BC 11/01/2011 10:23 AM   Other:  Joslyn Devon, RN 11/01/2011 10:23 AM   Other:    Other:    Other:    Scribe for Treatment Team:  Clarice Pole, LCASA 11/01/2011 10:23 AM

## 2011-11-02 ENCOUNTER — Encounter (HOSPITAL_COMMUNITY): Payer: Self-pay | Admitting: *Deleted

## 2011-11-03 NOTE — Progress Notes (Signed)
Patient Discharge Instructions:  After Visit Summary (AVS):   Faxed to:  11/03/2011 Psychiatric Admission Assessment Note:   Faxed to:  11/03/2011 Suicide Risk Assessment - Discharge Assessment:   Faxed to:  11/03/2011 Faxed/Sent to the Next Level Care provider:  11/03/2011  Faxed to [facility] Dr. Ronne Binning @ [phone]  Heloise Purpura, Eduard Clos, 11/03/2011, 4:38 PM

## 2011-11-20 NOTE — Discharge Summary (Signed)
Physician Discharge Summary Note  Patient:  [name] is an 21 y.o., male MRN:  [id number] DOB:  1990/10/21 Patient phone:  [phone] (home)  Patient address:   [address] [place] [address]   Date of Admission:  10/25/2011 Date of Discharge: 11/01/2011  Discharge Diagnoses: Principal Problem:  *Schizoaffective disorder, bipolar type  Axis Diagnosis:  AXIS I: Schizoaffective Disorder - Bipolar Type.  AXIS II: Deferred.  AXIS III: None Reported.  AXIS IV: Chronic Mental Illness. Non-compliance with medications. Family Conflict.  AXIS V: GAF at time of admission approximately 35. GAF at time of discharge approximately 55.   Level of Care:  Inpatient Psychiatric Hospitalization.  Reason for Admission: This is an involuntary admission for [name] who was brought to the ED by his ACT Team, after refusing his medications at home for 3 days. He has stated that he wanted to kill his mother and grandmother but has no intent or plan.  Hospital Course:   The patient attended treatment team meeting this am and met with treatment team members. The patient's symptoms, treatment plan and response to treatment was discussed. The patient endorsed that their symptoms have improved. The patient also stated that they felt stable for discharge.  They reported that from this hospital stay they had learned many coping skills.  In other to maintain their psychiatric stability, they will continue psychiatric care on an outpatient basis. They will follow-up as outlined below.  In addition they were instructed  to take all your medications as prescribed by their mental healthcare provider and to report any adverse effects and or reactions from your medicines to their outpatient provider promptly.  The patient is also instructed and cautioned to not engage in alcohol and or illegal drug use while on prescription medicines.  In the event of worsening symptoms the patient is instructed to call the  crisis hotline, 911 and or go to the nearest ED for appropriate evaluation and treatment of symptoms.   Also while a patient in this hospital, the patient received medication management for his psychiatric symptoms. They were ordered and received as outlined below:  While a patient in this hospital, the patient received medication management for their psychiatric symptoms. They were ordered and received medications as listed below:  Medication List  As of 11/20/2011  4:24 PM   TAKE these medications      Indication    citalopram 20 MG tablet   Commonly known as: CELEXA   Take 1 tablet (20 mg total) by mouth daily. For depression and anxiety.       lithium carbonate 300 MG capsule   Please take 1 capsule each morning and 3 capsules at bedtime for mood stabilization.       risperidone 4 MG tablet   Commonly known as: RISPERDAL   Take 1 tablet (4 mg total) by mouth at bedtime. For psychosis and mood stabilization.       risperiDONE microspheres 37.5 MG injection   Commonly known as: RISPERDAL CONSTA   Inject 2 mLs (37.5 mg total) into the muscle every 14 (fourteen) days. For psychosis.            They were also enrolled in group counseling sessions and activities in which they participated actively.   Follow-up Information    Follow up with [facility] on 11/03/2011. (ACTT Team to follow-up making contacts tomorrow.  Pt scheduled for an appt with Dr. Nash Shearer at 2pm on 11-03-11.)    [facility]  information[facility] [facility] [address] [place], [place] [address] [phone]        Upon discharge, patient adamantly denies suicidal, homicidal ideations, auditory, visual hallucinations and or delusional thinking. They left [facility] with all personal belongings via personal transportation in no apparent distress.  Consults:  Please see the patient's electronic medical records for more details.  Significant Diagnostic Studies:   Please see the patient's electronic medical records for more  details.  Discharge Vitals:   Blood pressure 134/87, pulse 100, temperature 98.5 F (36.9 C), temperature source Oral, resp. rate 16, height 5\' 7"  (1.702 m), weight 85.73 kg (189 lb)..  Mental Status Exam: Demographic factors:  (UTA)   Current Mental Status Per Nursing Assessment::  On Admission:  At Discharge: The patient was seen today and is Alert and Oriented x 3. The patient reports that he is sleeping well and reports a good appetite. He denies any significant feelings of sadness, anhedonia or depressed mood. He does report some moderate anxiety symptoms related to the unknowns of going to a group home today. He adamantly denies any suicidal or homicidal ideations and also denies any auditory or visual hallucinations or delusional thinking. The patient will meet his new Group Home staff today and will be discharged today if they are in agreement with the placement.   Current Mental Status Per Physician:   Diagnosis:  Axis I: Schizoaffective Disorder - Bipolar Type.   The patient was seen today and reports the following:   ADL's: Intact.  Sleep: The patient reports that he is sleeping well at night..  Appetite: The patient reports a good appetite this morning.   Mild>(1-10) >Severe  Hopelessness (1-10): 0  Depression (1-10): 0  Anxiety (1-10): 8   Suicidal Ideation: The patient adamantly denies any suicidal ideations today.  Plan: No  Intent: No  Means: No   Homicidal Ideation: The patient adamantly denies any homicidal ideations today.  Plan: No  Intent: No.  Means: No   General Appearance/Behavior: The patient was friendly and cooperative today with this provider.  Eye Contact: Good.  Speech: Appropriate in rate and volume with no pressuring noted today.  Motor Behavior: wnl.  Level of Consciousness: Alert and Oriented x 3.  Mental Status: Alert and Oriented x 3.  Mood: Essentially Euthymic.  Affect: Appears bright and full.  Anxiety Level: Moderate anxiety  reported today which the patient describes as worry about the unknowns of a group home placement.  Thought Process: wnl.  Thought Content: The patient denies any auditory or visual hallucinations today as well as any delusional thinking.  Perception: wnl.  Judgment: Fair.  Insight: Fair.  Cognition: Oriented to person, place and time.   Loss Factors:  Loss of placement at his Mother's home.   Historical Factors:  Long history of Mental Illness.   Risk Reduction Factors:  Good assess to healthcare. The patient is being discharged to a group home.   Continued Clinical Symptoms:  Previous Psychiatric Diagnoses and Treatments  Schizoaffective Affective Disorder - Bipolar Type.   Discharge Diagnoses:  AXIS I: Schizoaffective Disorder - Bipolar Type.  AXIS II: Deferred.  AXIS III: None Reported.  AXIS IV: Chronic Mental Illness. Non-compliance with medications. Family Conflict.  AXIS V: GAF at time of admission approximately 35. GAF at time of discharge approximately 55.   Cognitive Features That Contribute To Risk:  Polarized thinking   Current Medications:   .  citalopram  20 mg  Oral  Daily   .  lithium carbonate  300 mg  Oral  [id number]   .  lithium carbonate  900 mg  Oral  QHS   .  nicotine  21 mg  Transdermal  [id number]   .  risperiDONE  4 mg  Oral  QHS    Review of Systems:  Neurological: The patient denies any headaches today. He denies any seizures or dizziness.  G.I.: The patient denies any constipation or G.I. Upset today.  Musculoskeletal: The patient denies any musculoskeletal issues today.   Treatment Plan Summary:  1. Daily contact with patient to assess and evaluate symptoms and progress in treatment.  2. Medication management  3. The patient will deny suicidal ideations or homicidal ideations for 48 hours prior to discharge and have a depression and anxiety rating of 3 or less. The patient will also deny any auditory or visual hallucinations or delusional thinking.    4. The patient will deny any symptoms of substance withdrawal at time of discharge.   The patient was seen today and is Alert and Oriented x 3. The patient reports that he is sleeping well and reports a good appetite. He denies any significant feelings of sadness, anhedonia or depressed mood. He does report some moderate anxiety symptoms related to the unknowns of going to a group home today. He adamantly denies any suicidal or homicidal ideations and also denies any auditory or visual hallucinations or delusional thinking. The patient will meet his new Group Home staff today and will be discharged today if they are in agreement with the placement.   Plan:  1. Will continue the patient on his current medications as listed above.  2. Will continue to monitor.  3. Laboratory studies reviewed.  4. Discharge today to outpatient follow up.   Suicide Risk:  Minimal: No identifiable suicidal ideation. Patients presenting with no risk factors but with morbid ruminations; may be classified as minimal risk based on the severity of the depressive symptoms   Plan Of Care/Follow-up recommendations:  Activity: As tolerated.  Diet: Regular Diet.  Other: Please take all medications only as directed and keep all scheduled follow up appointments.  Discharge destination:  Home.  Is patient on multiple antipsychotic therapies at discharge:  No  Has Patient had three or more failed trials of antipsychotic monotherapy by history: N/A Recommended Plan for Multiple Antipsychotic Therapies: N/A  Discharge Orders    Future Orders Please Complete By Expires   Diet - low sodium heart healthy      Increase activity slowly      Discharge instructions      Comments:   Please take all medications only as prescribed and keep all scheduled follow up appointments.     Medication List  As of 11/20/2011  4:24 PM   TAKE these medications      Indication    citalopram 20 MG tablet   Commonly known as: CELEXA   Take  1 tablet (20 mg total) by mouth daily. For depression and anxiety.       lithium carbonate 300 MG capsule   Please take 1 capsule each morning and 3 capsules at bedtime for mood stabilization.       risperidone 4 MG tablet   Commonly known as: RISPERDAL   Take 1 tablet (4 mg total) by mouth at bedtime. For psychosis and mood stabilization.       risperiDONE microspheres 37.5 MG injection   Commonly known as: RISPERDAL CONSTA   Inject 2 mLs (37.5 mg total) into  the muscle every 14 (fourteen) days. For psychosis.            Follow-up Information    Follow up with [facility] on 11/03/2011. (ACTT Team to follow-up making contacts tomorrow.  Pt scheduled for an appt with Dr. Nash Shearer at 2pm on 11-03-11.)    Contact information:   [facility] Team [address] [place], [place] [address] [phone]       Follow-up recommendations:   Activities: Resume typical activities Diet: Resume typical diet Other: Follow up with outpatient provider and report any side effects to out patient prescriber.  Comments:  Take all your medications as prescribed by your mental healthcare provider. Report any adverse effects and or reactions from your medicines to your outpatient provider promptly. Patient is instructed and cautioned to not engage in alcohol and or illegal drug use while on prescription medicines. In the event of worsening symptoms, patient is instructed to call the crisis hotline, 911 and or go to the nearest ED for appropriate evaluation and treatment of symptoms.  Signed: Franchot Gallo 11/20/2011 4:24 PM

## 2012-01-09 DIAGNOSIS — Z23 Encounter for immunization: Secondary | ICD-10-CM | POA: Diagnosis not present

## 2012-01-11 DIAGNOSIS — B353 Tinea pedis: Secondary | ICD-10-CM | POA: Diagnosis not present

## 2012-01-11 DIAGNOSIS — F22 Delusional disorders: Secondary | ICD-10-CM | POA: Diagnosis not present

## 2012-01-11 DIAGNOSIS — E669 Obesity, unspecified: Secondary | ICD-10-CM | POA: Diagnosis not present

## 2012-01-11 DIAGNOSIS — E781 Pure hyperglyceridemia: Secondary | ICD-10-CM | POA: Diagnosis not present

## 2012-01-11 DIAGNOSIS — F329 Major depressive disorder, single episode, unspecified: Secondary | ICD-10-CM | POA: Diagnosis not present

## 2012-01-11 DIAGNOSIS — F259 Schizoaffective disorder, unspecified: Secondary | ICD-10-CM | POA: Diagnosis not present

## 2012-01-17 ENCOUNTER — Ambulatory Visit (HOSPITAL_COMMUNITY)
Admission: RE | Admit: 2012-01-17 | Discharge: 2012-01-17 | Disposition: A | Discharge status: Home / Self Care | Payer: Medicare Other | Source: Ambulatory Visit | Attending: Family Medicine | Admitting: Family Medicine

## 2012-01-17 ENCOUNTER — Other Ambulatory Visit (HOSPITAL_COMMUNITY): Payer: Self-pay | Admitting: Family Medicine

## 2012-01-17 DIAGNOSIS — Z111 Encounter for screening for respiratory tuberculosis: Secondary | ICD-10-CM

## 2012-02-08 DIAGNOSIS — F259 Schizoaffective disorder, unspecified: Secondary | ICD-10-CM | POA: Diagnosis not present

## 2012-02-08 DIAGNOSIS — E669 Obesity, unspecified: Secondary | ICD-10-CM | POA: Diagnosis not present

## 2012-02-08 DIAGNOSIS — F319 Bipolar disorder, unspecified: Secondary | ICD-10-CM | POA: Diagnosis not present

## 2012-02-08 DIAGNOSIS — F22 Delusional disorders: Secondary | ICD-10-CM | POA: Diagnosis not present

## 2012-05-08 DIAGNOSIS — F319 Bipolar disorder, unspecified: Secondary | ICD-10-CM | POA: Diagnosis not present

## 2012-05-08 DIAGNOSIS — F259 Schizoaffective disorder, unspecified: Secondary | ICD-10-CM | POA: Diagnosis not present

## 2012-05-08 DIAGNOSIS — F22 Delusional disorders: Secondary | ICD-10-CM | POA: Diagnosis not present

## 2012-05-24 DIAGNOSIS — R69 Illness, unspecified: Secondary | ICD-10-CM | POA: Diagnosis not present

## 2012-08-08 DIAGNOSIS — F329 Major depressive disorder, single episode, unspecified: Secondary | ICD-10-CM | POA: Diagnosis not present

## 2012-08-08 DIAGNOSIS — F2089 Other schizophrenia: Secondary | ICD-10-CM | POA: Diagnosis not present

## 2012-10-12 DIAGNOSIS — F259 Schizoaffective disorder, unspecified: Secondary | ICD-10-CM | POA: Diagnosis not present

## 2012-11-13 DIAGNOSIS — F2089 Other schizophrenia: Secondary | ICD-10-CM | POA: Diagnosis not present

## 2012-11-13 DIAGNOSIS — F319 Bipolar disorder, unspecified: Secondary | ICD-10-CM | POA: Diagnosis not present

## 2013-02-12 DIAGNOSIS — F2089 Other schizophrenia: Secondary | ICD-10-CM | POA: Diagnosis not present

## 2013-02-12 DIAGNOSIS — F200 Paranoid schizophrenia: Secondary | ICD-10-CM | POA: Diagnosis not present

## 2013-02-12 DIAGNOSIS — F319 Bipolar disorder, unspecified: Secondary | ICD-10-CM | POA: Diagnosis not present

## 2013-05-13 DIAGNOSIS — F2089 Other schizophrenia: Secondary | ICD-10-CM | POA: Diagnosis not present

## 2013-05-13 DIAGNOSIS — F3289 Other specified depressive episodes: Secondary | ICD-10-CM | POA: Diagnosis not present

## 2013-05-13 DIAGNOSIS — F329 Major depressive disorder, single episode, unspecified: Secondary | ICD-10-CM | POA: Diagnosis not present

## 2013-06-12 IMAGING — CR DG CHEST 2V
2 series · 2 of 2 positions shown · non-contrast
Comparison: None

CLINICAL DATA: TB screening for placement

CHEST - 2 VIEW

[view not recorded (1 of 2)]
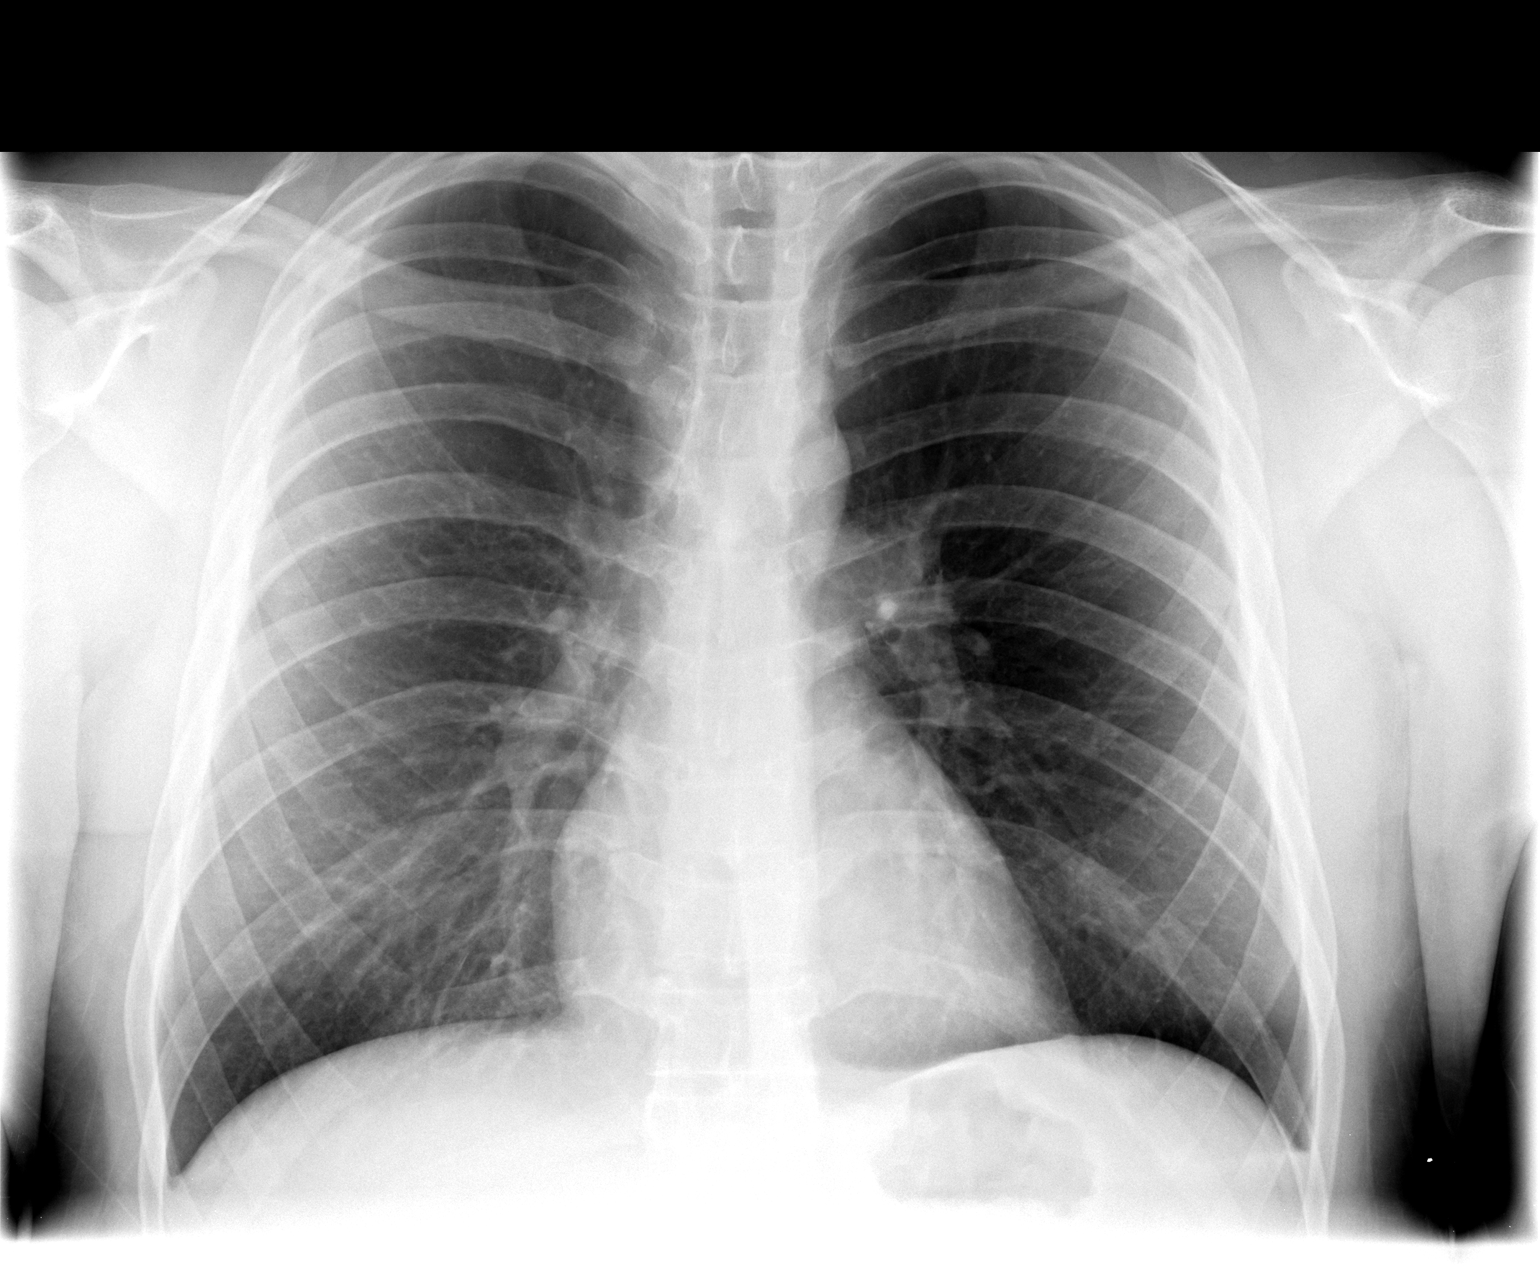

[view not recorded (2 of 2)]
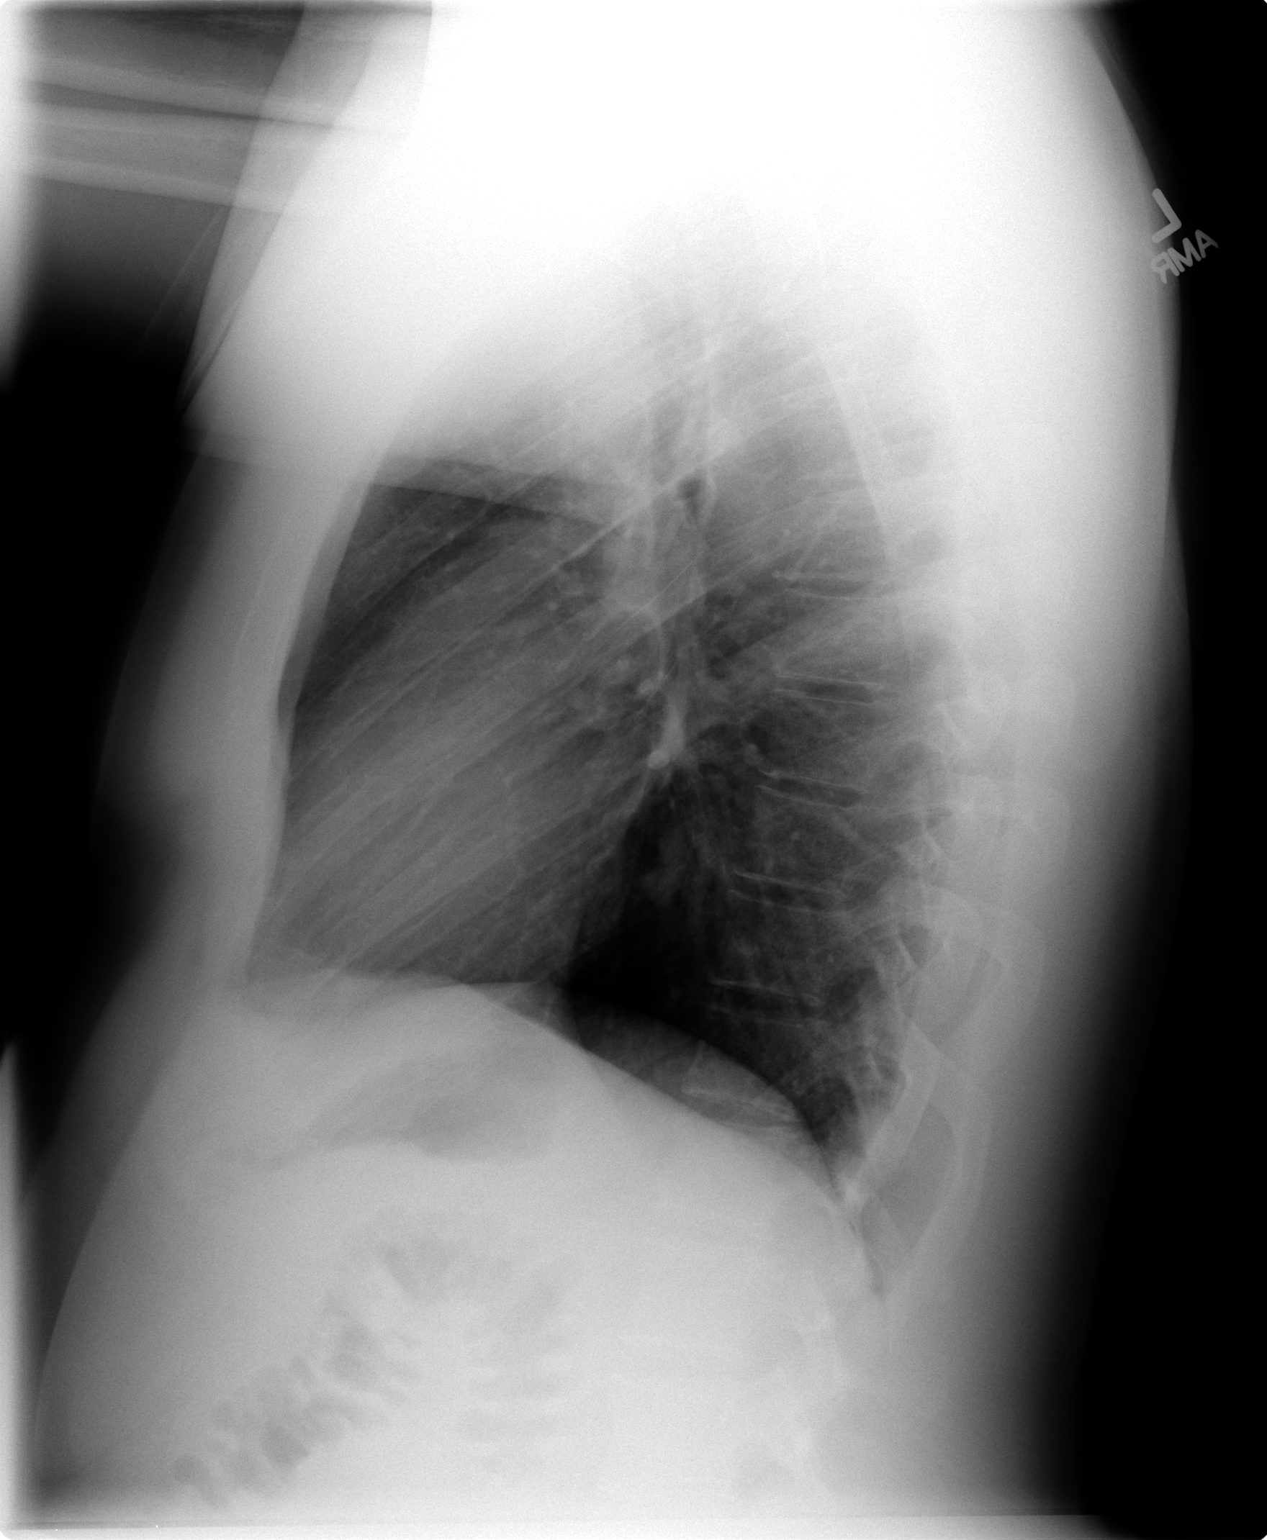

[2 of 2 positions shown; findings below may reference images not displayed]

FINDINGS: Normal heart size, mediastinal contours, and pulmonary vascularity.
Lungs clear.
Bones unremarkable.
No pneumothorax.
IMPRESSION: Normal exam.

## 2013-09-10 DIAGNOSIS — F2089 Other schizophrenia: Secondary | ICD-10-CM | POA: Diagnosis not present

## 2013-12-10 DIAGNOSIS — E781 Pure hyperglyceridemia: Secondary | ICD-10-CM | POA: Diagnosis not present

## 2013-12-10 DIAGNOSIS — E7800 Pure hypercholesterolemia, unspecified: Secondary | ICD-10-CM | POA: Diagnosis not present

## 2014-01-15 DIAGNOSIS — Z23 Encounter for immunization: Secondary | ICD-10-CM | POA: Diagnosis not present

## 2014-01-17 DIAGNOSIS — Z79899 Other long term (current) drug therapy: Secondary | ICD-10-CM | POA: Diagnosis not present

## 2014-03-10 DIAGNOSIS — E784 Other hyperlipidemia: Secondary | ICD-10-CM | POA: Diagnosis not present

## 2014-03-10 DIAGNOSIS — E785 Hyperlipidemia, unspecified: Secondary | ICD-10-CM | POA: Diagnosis not present

## 2014-06-07 DIAGNOSIS — Z79899 Other long term (current) drug therapy: Secondary | ICD-10-CM | POA: Diagnosis not present

## 2014-06-10 DIAGNOSIS — F250 Schizoaffective disorder, bipolar type: Secondary | ICD-10-CM | POA: Diagnosis not present

## 2014-06-10 DIAGNOSIS — E785 Hyperlipidemia, unspecified: Secondary | ICD-10-CM | POA: Diagnosis not present

## 2014-09-08 DIAGNOSIS — E785 Hyperlipidemia, unspecified: Secondary | ICD-10-CM | POA: Diagnosis not present

## 2014-09-17 ENCOUNTER — Encounter (HOSPITAL_COMMUNITY): Payer: Self-pay

## 2014-09-17 ENCOUNTER — Emergency Department (HOSPITAL_COMMUNITY)
Admission: EM | Admit: 2014-09-17 | Discharge: 2014-09-17 | Disposition: A | Discharge status: Home / Self Care | Payer: Medicare Other | Attending: Emergency Medicine | Admitting: Emergency Medicine

## 2014-09-17 DIAGNOSIS — L0501 Pilonidal cyst with abscess: Secondary | ICD-10-CM | POA: Insufficient documentation

## 2014-09-17 DIAGNOSIS — Z720 Tobacco use: Secondary | ICD-10-CM | POA: Insufficient documentation

## 2014-09-17 DIAGNOSIS — E669 Obesity, unspecified: Secondary | ICD-10-CM | POA: Diagnosis not present

## 2014-09-17 MED ORDER — HYDROCODONE-ACETAMINOPHEN 5-325 MG PO TABS: 1.0000 | ORAL_TABLET | ORAL | Status: DC | PRN

## 2014-09-17 MED ORDER — LIDOCAINE HCL (PF) 1 % IJ SOLN
INTRAMUSCULAR | Status: AC
  Filled 2014-09-17: qty 5

## 2014-09-17 MED ORDER — SULFAMETHOXAZOLE-TRIMETHOPRIM 800-160 MG PO TABS: 1.0000 | ORAL_TABLET | Freq: Two times a day (BID) | ORAL | Status: AC

## 2014-09-17 NOTE — ED Provider Notes (Signed)
CSN: [id number]     Arrival date & time 09/17/14  1922 History   First MD Initiated Contact with Patient 09/17/14 1936     Chief Complaint  Patient presents with  . Abscess     (Consider location/radiation/quality/duration/timing/severity/associated sxs/prior Treatment) Patient is a 24 y.o. male presenting with abscess. The history is provided by the patient.  Abscess Location:  Ano-genital Ano-genital abscess location:  Gluteal cleft Abscess quality: fluctuance, painful, redness and warmth   Red streaking: no   Duration:  4 days Progression:  Worsening Pain details:    Quality:  Throbbing and shooting   Severity:  Severe   Duration:  4 days   Timing:  Constant   Progression:  Worsening Chronicity:  New Relieved by:  Nothing Exacerbated by: sitting.   Gwin Puccinelli is a 23 y.o. male who presents to the ED with an abscess. He states that the pain and swelling started 4 days ago and has gotten worse. The abscess is located at the base of his spine in the fold of the buttock.   History reviewed. No pertinent past medical history. Past Surgical History  Procedure Laterality Date  . Knee arthroscopy     History reviewed. No pertinent family history. History  Substance Use Topics  . Smoking status: Current Every Day Smoker    Types: Cigarettes  . Smokeless tobacco: Not on file  . Alcohol Use: No    Review of Systems Negative except as stated in HPI   Allergies  Review of patient's allergies indicates no known allergies.  Home Medications   Prior to Admission medications   Medication Sig Start Date End Date Taking? Authorizing Provider  HYDROcodone-acetaminophen (NORCO/VICODIN) 5-325 MG per tablet Take 1 tablet by mouth every 4 (four) hours as needed. 09/17/14   Hope Orlene Och, NP  sulfamethoxazole-trimethoprim (BACTRIM DS,SEPTRA DS) 800-160 MG per tablet Take 1 tablet by mouth 2 (two) times daily. 09/17/14 09/24/14  Hope Orlene Och, NP   BP 121/67 mmHg  Pulse 87  Temp(Src)  99.2 F (37.3 C) (Oral)  Resp 24  Ht  (1.702 m)  Wt 235 lb 14.4 oz (107.004 kg)  BMI 36.94 kg/m2  SpO2 99% Physical Exam  Constitutional: He is oriented to person, place, and time. No distress.  Obese  HENT:  Head: Normocephalic.  Eyes: EOM are normal.  Neck: Neck supple.  Cardiovascular: Normal rate.   Pulmonary/Chest: Effort normal.  Musculoskeletal: Normal range of motion.       Back:  Tender, raised area to the gluteal cleft. There is fluctuance and drainage from the area. There is an area that extends upward that is firm and tender.   Neurological: He is alert and oriented to person, place, and time. No cranial nerve deficit.  Skin: Skin is warm and dry.  Psychiatric: He has a normal mood and affect. His behavior is normal.  Nursing note and vitals reviewed.  Discussed with the patient that since the area is draining very well at this time I can either make a larger incision and pack it or he can sit in warm water and take antibiotics and follow up with Dr. Lovell Sheehan. He request follow up with Dr. Lovell Sheehan since the area has drained and is actually feeling some better.   ED Course: the abscess was beginning to drain and I was able to press gently around the area and drain a large amount of purulent malodorous drainage.   Procedures (including critical care time) Labs Review  MDM  24 y.o. male with pain and swelling to the area of the gluteal cleft. Stable for d/c to follow up with Dr. Lovell Sheehan. Will start antibiotics and pain medication. He will return as needed for worsening symptoms.  Final diagnoses:  Pilonidal cyst with abscess      Janne Napoleon, NP 09/17/14 2038  Raeford Razor, MD 09/19/14 1056

## 2014-09-17 NOTE — Discharge Instructions (Signed)
Abscess An abscess (boil or furuncle) is an infected area on or under the skin. This area is filled with yellowish-white fluid (pus) and other material (debris). HOME CARE   Only take medicines as told by your doctor.  If you were given antibiotic medicine, take it as directed. Finish the medicine even if you start to feel better.  If gauze is used, follow your doctor's directions for changing the gauze.  To avoid spreading the infection:  Keep your abscess covered with a bandage.  Wash your hands well.  Do not share personal care items, towels, or whirlpools with others.  Avoid skin contact with others.  Keep your skin and clothes clean around the abscess.  Keep all doctor visits as told. GET HELP RIGHT AWAY IF:   You have more pain, puffiness (swelling), or redness in the wound site.  You have more fluid or blood coming from the wound site.  You have muscle aches, chills, or you feel sick.  You have a fever. MAKE SURE YOU:   Understand these instructions.  Will watch your condition.  Will get help right away if you are not doing well or get worse. Document Released: 09/07/2007 Document Revised: 09/20/2011 Document Reviewed: 06/03/2011 [facility] Patient Information 2015 [place], [place]. This information is not intended to replace advice given to you by your health care provider. Make sure you discuss any questions you have with your health care provider.  Pilonidal Cyst A pilonidal cyst occurs when hairs get trapped (ingrown) beneath the skin in the crease between the buttocks over your sacrum (the bone under that crease). Pilonidal cysts are most common in young men with a lot of body hair. When the cyst is ruptured (breaks) or leaking, fluid from the cyst may cause burning and itching. If the cyst becomes infected, it causes a painful swelling filled with pus (abscess). The pus and trapped hairs need to be removed (often by lancing) so that the infection can heal.  However, recurrence is common and an operation may be needed to remove the cyst. HOME CARE INSTRUCTIONS   If the cyst was NOT INFECTED:  Keep the area clean and dry. Bathe or shower daily. Wash the area well with a germ-killing soap. Warm tub baths may help prevent infection and help with drainage. Dry the area well with a towel.  Avoid tight clothing to keep area as moisture free as possible.  Keep area between buttocks as free of hair as possible. A depilatory may be used.  If the cyst WAS INFECTED and needed to be drained:  Your caregiver packed the wound with gauze to keep the wound open. This allows the wound to heal from the inside outwards and continue draining.  Return for a wound check in 1 day or as suggested.  If you take tub baths or showers, repack the wound with gauze following them. Sponge baths (at the sink) are a good alternative.  If an antibiotic was ordered to fight the infection, take as directed.  Only take over-the-counter or prescription medicines for pain, discomfort, or fever as directed by your caregiver.  After the drain is removed, use sitz baths for 20 minutes 4 times per day. Clean the wound gently with mild unscented soap, pat dry, and then apply a dry dressing. SEEK MEDICAL CARE IF:   You have increased pain, swelling, redness, drainage, or bleeding from the area.  You have a fever.  You have muscles aches, dizziness, or a general ill feeling. Document Released: 03/18/2000  Document Revised: 06/13/2011 Document Reviewed: 05/16/2008 [facility] Patient Information 2015 [place], [place]. This information is not intended to replace advice given to you by your health care provider. Make sure you discuss any questions you have with your health care provider.

## 2014-09-17 NOTE — ED Notes (Signed)
Patient states abscess to buttocks X4 days. Patient denies drainage. C/o pain when sitting.

## 2014-09-18 ENCOUNTER — Encounter (HOSPITAL_COMMUNITY): Payer: Self-pay | Admitting: *Deleted

## 2014-12-10 DIAGNOSIS — E785 Hyperlipidemia, unspecified: Secondary | ICD-10-CM | POA: Diagnosis not present

## 2014-12-10 DIAGNOSIS — F250 Schizoaffective disorder, bipolar type: Secondary | ICD-10-CM | POA: Diagnosis not present

## 2014-12-10 DIAGNOSIS — E669 Obesity, unspecified: Secondary | ICD-10-CM | POA: Diagnosis not present

## 2015-01-19 DIAGNOSIS — Z79899 Other long term (current) drug therapy: Secondary | ICD-10-CM | POA: Diagnosis not present

## 2015-01-19 DIAGNOSIS — F250 Schizoaffective disorder, bipolar type: Secondary | ICD-10-CM | POA: Diagnosis not present

## 2015-01-21 DIAGNOSIS — Z23 Encounter for immunization: Secondary | ICD-10-CM | POA: Diagnosis not present

## 2015-03-10 DIAGNOSIS — Z6835 Body mass index (BMI) 35.0-35.9, adult: Secondary | ICD-10-CM | POA: Diagnosis not present

## 2015-03-10 DIAGNOSIS — E785 Hyperlipidemia, unspecified: Secondary | ICD-10-CM | POA: Diagnosis not present

## 2015-06-25 DIAGNOSIS — Z79899 Other long term (current) drug therapy: Secondary | ICD-10-CM | POA: Diagnosis not present

## 2015-06-25 DIAGNOSIS — F250 Schizoaffective disorder, bipolar type: Secondary | ICD-10-CM | POA: Diagnosis not present

## 2015-11-03 DIAGNOSIS — E785 Hyperlipidemia, unspecified: Secondary | ICD-10-CM | POA: Diagnosis not present

## 2015-11-03 DIAGNOSIS — Z6835 Body mass index (BMI) 35.0-35.9, adult: Secondary | ICD-10-CM | POA: Diagnosis not present

## 2016-02-23 DIAGNOSIS — Z23 Encounter for immunization: Secondary | ICD-10-CM | POA: Diagnosis not present

## 2016-02-23 DIAGNOSIS — R030 Elevated blood-pressure reading, without diagnosis of hypertension: Secondary | ICD-10-CM | POA: Diagnosis not present

## 2016-02-23 DIAGNOSIS — E785 Hyperlipidemia, unspecified: Secondary | ICD-10-CM | POA: Diagnosis not present

## 2016-05-13 DIAGNOSIS — Z79899 Other long term (current) drug therapy: Secondary | ICD-10-CM | POA: Diagnosis not present

## 2016-05-13 DIAGNOSIS — F250 Schizoaffective disorder, bipolar type: Secondary | ICD-10-CM | POA: Diagnosis not present

## 2016-05-24 DIAGNOSIS — E785 Hyperlipidemia, unspecified: Secondary | ICD-10-CM | POA: Diagnosis not present

## 2016-05-24 DIAGNOSIS — F600 Paranoid personality disorder: Secondary | ICD-10-CM | POA: Diagnosis not present

## 2016-05-24 DIAGNOSIS — F259 Schizoaffective disorder, unspecified: Secondary | ICD-10-CM | POA: Diagnosis not present

## 2016-05-24 DIAGNOSIS — E669 Obesity, unspecified: Secondary | ICD-10-CM | POA: Diagnosis not present

## 2016-08-22 DIAGNOSIS — E669 Obesity, unspecified: Secondary | ICD-10-CM | POA: Diagnosis not present

## 2016-08-22 DIAGNOSIS — F319 Bipolar disorder, unspecified: Secondary | ICD-10-CM | POA: Diagnosis not present

## 2016-08-22 DIAGNOSIS — F259 Schizoaffective disorder, unspecified: Secondary | ICD-10-CM | POA: Diagnosis not present

## 2016-08-22 DIAGNOSIS — E785 Hyperlipidemia, unspecified: Secondary | ICD-10-CM | POA: Diagnosis not present

## 2016-08-22 DIAGNOSIS — F600 Paranoid personality disorder: Secondary | ICD-10-CM | POA: Diagnosis not present

## 2016-10-27 ENCOUNTER — Encounter (HOSPITAL_COMMUNITY): Payer: Self-pay | Admitting: Emergency Medicine

## 2016-10-27 ENCOUNTER — Emergency Department (HOSPITAL_COMMUNITY)
Admission: EM | Admit: 2016-10-27 | Discharge: 2016-10-28 | Disposition: A | Discharge status: Other Acute Inpatient Hospital | Payer: Medicare Other | Attending: Emergency Medicine | Admitting: Emergency Medicine

## 2016-10-27 DIAGNOSIS — F17210 Nicotine dependence, cigarettes, uncomplicated: Secondary | ICD-10-CM | POA: Insufficient documentation

## 2016-10-27 DIAGNOSIS — Z79899 Other long term (current) drug therapy: Secondary | ICD-10-CM | POA: Diagnosis not present

## 2016-10-27 DIAGNOSIS — F29 Unspecified psychosis not due to a substance or known physiological condition: Secondary | ICD-10-CM | POA: Diagnosis not present

## 2016-10-27 DIAGNOSIS — F250 Schizoaffective disorder, bipolar type: Secondary | ICD-10-CM | POA: Insufficient documentation

## 2016-10-27 DIAGNOSIS — R440 Auditory hallucinations: Secondary | ICD-10-CM | POA: Diagnosis present

## 2016-10-27 LAB — COMPREHENSIVE METABOLIC PANEL
ALT: 29 U/L (ref 17–63)
ANION GAP: 7 (ref 5–15)
AST: 22 U/L (ref 15–41)
Albumin: 4.3 g/dL (ref 3.5–5.0)
Alkaline Phosphatase: 55 U/L (ref 38–126)
BILIRUBIN TOTAL: 0.5 mg/dL (ref 0.3–1.2)
BUN: 19 mg/dL (ref 6–20)
CHLORIDE: 106 mmol/L (ref 101–111)
CO2: 24 mmol/L (ref 22–32)
Calcium: 9.4 mg/dL (ref 8.9–10.3)
Creatinine, Ser: 1.43 mg/dL — ABNORMAL HIGH (ref 0.61–1.24)
GFR calc Af Amer: >60 mL/min (ref >60)
GFR calc non Af Amer: >60 mL/min (ref >60)
GLUCOSE: 91 mg/dL (ref 65–99)
POTASSIUM: 3.9 mmol/L (ref 3.5–5.1)
SODIUM: 137 mmol/L (ref 135–145)
Total Protein: 6.9 g/dL (ref 6.5–8.1)

## 2016-10-27 LAB — RAPID URINE DRUG SCREEN, HOSP PERFORMED
AMPHETAMINES: NOT DETECTED
BARBITURATES: NOT DETECTED
BENZODIAZEPINES: NOT DETECTED
COCAINE: NOT DETECTED
Opiates: NOT DETECTED
Tetrahydrocannabinol: NOT DETECTED

## 2016-10-27 LAB — CBC WITH DIFFERENTIAL/PLATELET
Basophils Absolute: 0 10*3/uL (ref 0.0–0.1)
Basophils Relative: 0 %
EOS ABS: 0.1 10*3/uL (ref 0.0–0.7)
EOS PCT: 1 %
HCT: 37.1 % — ABNORMAL LOW (ref 39.0–52.0)
Hemoglobin: 12.5 g/dL — ABNORMAL LOW (ref 13.0–17.0)
LYMPHS ABS: 2.2 10*3/uL (ref 0.7–4.0)
LYMPHS PCT: 22 %
MCH: 29.2 pg (ref 26.0–34.0)
MCHC: 33.7 g/dL (ref 30.0–36.0)
MCV: 86.7 fL (ref 78.0–100.0)
MONO ABS: 0.9 10*3/uL (ref 0.1–1.0)
Monocytes Relative: 9 %
Neutro Abs: 6.7 10*3/uL (ref 1.7–7.7)
Neutrophils Relative %: 68 %
PLATELETS: 211 10*3/uL (ref 150–400)
RBC: 4.28 MIL/uL (ref 4.22–5.81)
RDW: 13.9 % (ref 11.5–15.5)
WBC: 9.9 10*3/uL (ref 4.0–10.5)

## 2016-10-27 LAB — ETHANOL: Alcohol, Ethyl (B): <5 mg/dL (ref <5)

## 2016-10-27 LAB — LITHIUM LEVEL: Lithium Lvl: <0.06 mmol/L — ABNORMAL LOW (ref 0.60–1.20)

## 2016-10-27 MED ORDER — RISPERIDONE MICROSPHERES 37.5 MG IM SUSR
37.5000 mg | INTRAMUSCULAR | Status: DC
  Filled 2016-10-27: qty 2

## 2016-10-27 MED ORDER — RISPERIDONE MICROSPHERES 37.5 MG IM SUSR: 37.5000 mg | INTRAMUSCULAR | Status: DC

## 2016-10-27 MED ORDER — TRAZODONE HCL 50 MG PO TABS
100.0000 mg | ORAL_TABLET | Freq: Every day | ORAL | Status: DC
  Administered 2016-10-27: 100 mg via ORAL
  Filled 2016-10-27: qty 2

## 2016-10-27 MED ORDER — RISPERIDONE 2 MG PO TABS
4.0000 mg | ORAL_TABLET | Freq: Every day | ORAL | Status: DC
  Administered 2016-10-27: 4 mg via ORAL
  Filled 2016-10-27 (×4): qty 2

## 2016-10-27 MED ORDER — LITHIUM CARBONATE ER 450 MG PO TBCR
900.0000 mg | EXTENDED_RELEASE_TABLET | Freq: Every day | ORAL | Status: DC
Start: 2016-10-27 — End: 2016-10-28
  Administered 2016-10-27: 900 mg via ORAL
  Filled 2016-10-27 (×4): qty 2

## 2016-10-27 MED ORDER — RISPERIDONE 1 MG PO TABS
ORAL_TABLET | ORAL | Status: AC
  Filled 2016-10-27: qty 4

## 2016-10-27 MED ORDER — CITALOPRAM HYDROBROMIDE 20 MG PO TABS
20.0000 mg | ORAL_TABLET | Freq: Every day | ORAL | Status: DC
  Administered 2016-10-28: 20 mg via ORAL
  Filled 2016-10-27 (×4): qty 1

## 2016-10-27 MED ORDER — MIRTAZAPINE 15 MG PO TABS
15.0000 mg | ORAL_TABLET | Freq: Every day | ORAL | Status: DC
Start: 2016-10-27 — End: 2016-10-28
  Administered 2016-10-27: 15 mg via ORAL
  Filled 2016-10-27 (×4): qty 1

## 2016-10-27 MED ORDER — PALIPERIDONE ER 6 MG PO TB24
6.0000 mg | ORAL_TABLET | Freq: Every day | ORAL | Status: DC
  Administered 2016-10-28: 6 mg via ORAL
  Filled 2016-10-27 (×4): qty 1

## 2016-10-27 MED ORDER — MIRTAZAPINE 30 MG PO TABS
ORAL_TABLET | ORAL | Status: AC
  Filled 2016-10-27: qty 1

## 2016-10-27 NOTE — Progress Notes (Signed)
Chart reviewed. Case discussed with TTS. Recommend inpatient.
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
## 2016-10-27 NOTE — ED Notes (Signed)
Pt given meal

## 2016-10-27 NOTE — ED Triage Notes (Signed)
Pt c/o feeling disoriented. Reports auditory hallucinations today. Mother states patient was yelling and shouting lying on the bathroom floor. Mother states patient talks to people who aren't there. Pt started new medication x 1 week ago. Pt denies active SI/HI but states "I just don't know what to do" "i don't know what's real and what's not". Mother states pt has violent outbursts. Pt wanded by security in triage.

## 2016-10-27 NOTE — ED Provider Notes (Signed)
[facility] Provider Note   CSN: [id number] Arrival date & time: 10/27/16  1837     History   Chief Complaint Chief Complaint  Patient presents with  . Medical Clearance    HPI [name] is a 26 y.o. male.  Patient has bipolar and schizophrenic. He started new medicine a week ago. He has been hearing voices and has been very agitated quite with mom   The history is provided by the patient and a relative. No language interpreter was used.  Altered Mental Status   This is a new problem. The current episode started more than 1 week ago. The problem has not changed since onset.Associated symptoms include confusion and hallucinations. Pertinent negatives include no seizures. Risk factors: Schizophrenic. His past medical history does not include seizures.    Past Medical History:  Diagnosis Date  . Bipolar 1 disorder [facility](HCC)     Patient Active Problem List   Diagnosis Date Noted  . Schizoaffective disorder, bipolar type (HCC) 10/25/2011  . TEAR A C L 01/21/2008  . SUBLUXATION PATELLAR (MALALIGNMENT) 12/17/2007  . PATELLAR DISLOCATION, LEFT 12/17/2007    Past Surgical History:  Procedure Laterality Date  . KNEE ARTHROSCOPY    . KNEE SURGERY         Home Medications    Prior to Admission medications   Medication Sig Start Date End Date Taking? Authorizing Provider  citalopram (CELEXA) 20 MG tablet Take 1 tablet (20 mg total) by mouth daily. For depression and anxiety. 11/01/11 10/31/12  Readling, Curlene Labrumandy D, MD  lithium carbonate (ESKALITH) 450 MG CR tablet Take 900 mg by mouth at bedtime.     [provider]  lithium carbonate 300 MG capsule Please take 1 capsule each morning and 3 capsules at bedtime for mood stabilization. 11/01/11   Readling, Curlene Labrumandy D, MD  mirtazapine (REMERON) 15 MG tablet Take 15 mg by mouth at bedtime.      [provider]  paliperidone (INVEGA) 6 MG 24 hr tablet Take 6 mg by mouth every morning.      [provider]   risperiDONE (RISPERDAL) 4 MG tablet Take 1 tablet (4 mg total) by mouth at bedtime. For psychosis and mood stabilization. 11/01/11 12/01/11  Readling, Curlene Labrumandy D, MD  risperiDONE microspheres (RISPERDAL CONSTA) 37.5 MG injection Inject 2 mLs (37.5 mg total) into the muscle every 14 (fourteen) days. For psychosis. 11/01/11   Readling, Curlene Labrumandy D, MD  traZODone (DESYREL) 100 MG tablet Take 100 mg by mouth at bedtime.      [provider]    Family History No family history on file.  Social History Social History  Substance Use Topics  . Smoking status: Current Every Day Smoker    Types: Cigarettes  . Smokeless tobacco: Never Used  . Alcohol use No     Allergies   Patient has no known allergies.   Review of Systems Review of Systems  Constitutional: Negative for appetite change and fatigue.  HENT: Negative for congestion, ear discharge and sinus pressure.   Eyes: Negative for discharge.  Respiratory: Negative for cough.   Cardiovascular: Negative for chest pain.  Gastrointestinal: Negative for abdominal pain and diarrhea.  Genitourinary: Negative for frequency and hematuria.  Musculoskeletal: Negative for back pain.  Skin: Negative for rash.  Neurological: Negative for seizures and headaches.  Psychiatric/Behavioral: Positive for confusion and hallucinations.     Physical Exam Updated Vital Signs BP (!) 152/87 (BP Location: Right Arm)   Pulse 92  Temp 98.1 F (36.7 C) (Oral)   Resp 16   SpO2 98%   Physical Exam  Constitutional: He appears well-developed.  HENT:  Head: Normocephalic.  Eyes: Conjunctivae and EOM are normal. No scleral icterus.  Neck: Neck supple. No thyromegaly present.  Cardiovascular: Normal rate and regular rhythm.  Exam reveals no gallop and no friction rub.   No murmur heard. Pulmonary/Chest: No stridor. He has no wheezes. He has no rales. He exhibits no tenderness.  Abdominal: He exhibits no distension. There is no tenderness. There is no  rebound.  Musculoskeletal: Normal range of motion. He exhibits no edema.  Lymphadenopathy:    He has no cervical adenopathy.  Neurological: He is alert. He exhibits normal muscle tone. Coordination normal.  Skin: No rash noted. No erythema.  Psychiatric:  Patient has auditory hallucinations and has thought about hurting people     ED Treatments / Results  Labs (all labs ordered are listed, but only abnormal results are displayed) Labs Reviewed  COMPREHENSIVE METABOLIC PANEL - Abnormal; Notable for the following:       Result Value   Creatinine, Ser 1.43 (*)    All other components within normal limits  CBC WITH DIFFERENTIAL/PLATELET - Abnormal; Notable for the following:    Hemoglobin 12.5 (*)    HCT 37.1 (*)    All other components within normal limits  LITHIUM LEVEL - Abnormal; Notable for the following:    Lithium Lvl <0.06 (*)    All other components within normal limits  ETHANOL  RAPID URINE DRUG SCREEN, HOSP PERFORMED    EKG  EKG Interpretation None       Radiology No results found.  Procedures Procedures (including critical care time)  Medications Ordered in ED Medications - No data to display   Initial Impression / Assessment and Plan / ED Course  I have reviewed the triage vital signs and the nursing notes.  Pertinent labs & imaging results that were available during my care of the patient were reviewed by me and considered in my medical decision making (see chart for details).     Patient's psychotic with auditory hallucinations. He will be admitted to psych  Final Clinical Impressions(s) / ED Diagnoses   Final diagnoses:  None    New Prescriptions New Prescriptions   No medications on file     Bethann BerkshireZammit, Parlee Amescua, MD 10/27/16 2105

## 2016-10-28 ENCOUNTER — Inpatient Hospital Stay (HOSPITAL_COMMUNITY)
Admission: AD | Admit: 2016-10-28 | Discharge: 2016-11-04 | DRG: 885 | Disposition: A | Discharge status: Home / Self Care | Payer: Medicare Other | Source: Intra-hospital | Attending: Psychiatry | Admitting: Psychiatry

## 2016-10-28 ENCOUNTER — Encounter (HOSPITAL_COMMUNITY): Payer: Self-pay

## 2016-10-28 DIAGNOSIS — F419 Anxiety disorder, unspecified: Secondary | ICD-10-CM | POA: Diagnosis present

## 2016-10-28 DIAGNOSIS — Z9114 Patient's other noncompliance with medication regimen: Secondary | ICD-10-CM | POA: Diagnosis not present

## 2016-10-28 DIAGNOSIS — F5105 Insomnia due to other mental disorder: Secondary | ICD-10-CM | POA: Diagnosis present

## 2016-10-28 DIAGNOSIS — G4700 Insomnia, unspecified: Secondary | ICD-10-CM | POA: Diagnosis not present

## 2016-10-28 DIAGNOSIS — R443 Hallucinations, unspecified: Secondary | ICD-10-CM | POA: Diagnosis not present

## 2016-10-28 DIAGNOSIS — F17210 Nicotine dependence, cigarettes, uncomplicated: Secondary | ICD-10-CM | POA: Diagnosis not present

## 2016-10-28 DIAGNOSIS — R45851 Suicidal ideations: Secondary | ICD-10-CM | POA: Diagnosis not present

## 2016-10-28 DIAGNOSIS — F39 Unspecified mood [affective] disorder: Secondary | ICD-10-CM | POA: Diagnosis not present

## 2016-10-28 DIAGNOSIS — Z79899 Other long term (current) drug therapy: Secondary | ICD-10-CM | POA: Diagnosis not present

## 2016-10-28 DIAGNOSIS — F250 Schizoaffective disorder, bipolar type: Principal | ICD-10-CM | POA: Diagnosis present

## 2016-10-28 MED ORDER — RISPERIDONE MICROSPHERES 37.5 MG IM SUSR
37.5000 mg | INTRAMUSCULAR | Status: DC
  Administered 2016-10-31: 37.5 mg via INTRAMUSCULAR
  Filled 2016-10-28: qty 2

## 2016-10-28 MED ORDER — HYDROXYZINE HCL 25 MG PO TABS
25.0000 mg | ORAL_TABLET | Freq: Three times a day (TID) | ORAL | Status: DC | PRN
  Administered 2016-10-31 – 2016-11-02 (×2): 25 mg via ORAL
  Filled 2016-10-28 (×3): qty 1

## 2016-10-28 MED ORDER — ACETAMINOPHEN 325 MG PO TABS: 650.0000 mg | ORAL_TABLET | Freq: Four times a day (QID) | ORAL | Status: DC | PRN

## 2016-10-28 MED ORDER — TRAZODONE HCL 100 MG PO TABS
100.0000 mg | ORAL_TABLET | Freq: Every day | ORAL | Status: DC
  Administered 2016-10-28 – 2016-10-29 (×2): 100 mg via ORAL
  Filled 2016-10-28 (×5): qty 1

## 2016-10-28 MED ORDER — LITHIUM CARBONATE ER 450 MG PO TBCR
900.0000 mg | EXTENDED_RELEASE_TABLET | Freq: Every day | ORAL | Status: DC
  Administered 2016-10-28 – 2016-10-29 (×2): 900 mg via ORAL
  Filled 2016-10-28 (×4): qty 2

## 2016-10-28 MED ORDER — ALUM & MAG HYDROXIDE-SIMETH 200-200-20 MG/5ML PO SUSP: 30.0000 mL | ORAL | Status: DC | PRN

## 2016-10-28 MED ORDER — MAGNESIUM HYDROXIDE 400 MG/5ML PO SUSP: 30.0000 mL | Freq: Every day | ORAL | Status: DC | PRN

## 2016-10-28 MED ORDER — NICOTINE 21 MG/24HR TD PT24
21.0000 mg | MEDICATED_PATCH | Freq: Every day | TRANSDERMAL | Status: DC
  Administered 2016-10-30: 21 mg via TRANSDERMAL
  Filled 2016-10-28 (×6): qty 1

## 2016-10-28 MED ORDER — CITALOPRAM HYDROBROMIDE 20 MG PO TABS
20.0000 mg | ORAL_TABLET | Freq: Every day | ORAL | Status: DC
  Administered 2016-10-29 – 2016-11-04 (×7): 20 mg via ORAL
  Filled 2016-10-28 (×10): qty 1

## 2016-10-28 MED ORDER — MIRTAZAPINE 15 MG PO TABS
15.0000 mg | ORAL_TABLET | Freq: Every day | ORAL | Status: DC
  Administered 2016-10-28 – 2016-10-29 (×2): 15 mg via ORAL
  Filled 2016-10-28 (×5): qty 1

## 2016-10-28 MED ORDER — RISPERIDONE 2 MG PO TABS
4.0000 mg | ORAL_TABLET | Freq: Every day | ORAL | Status: DC
  Administered 2016-10-28 – 2016-11-03 (×7): 4 mg via ORAL
  Filled 2016-10-28 (×9): qty 2

## 2016-10-28 MED ORDER — PALIPERIDONE ER 6 MG PO TB24
6.0000 mg | ORAL_TABLET | Freq: Every day | ORAL | Status: DC
  Administered 2016-10-29: 6 mg via ORAL
  Filled 2016-10-28 (×3): qty 1

## 2016-10-28 NOTE — Progress Notes (Signed)
CSW received a call back from patient's mother, [name] [phone]).   Per patient's mother, the patient does not have an I/DD diagnoses, and that she is only aware of patient's diagnoses of Schizoaffective Disorder, Bipolar type.   Baldo DaubJolan Skylar Priest MSW, LCSWA CSW Disposition [phone]

## 2016-10-28 NOTE — ED Notes (Addendum)
Pt is beading sweat on face and chest, pt states he is getting hot and cold. Temp 98.7 prior to administration of medication. Pt denies drug or alcohol use. Dr. Clarene DukeMcManus informed of findings.

## 2016-10-28 NOTE — ED Notes (Signed)
Pt ambulated to bathroom, meal given.

## 2016-10-28 NOTE — Progress Notes (Signed)
Patient ID: Reynold BowenRaheem D Corkery, male   DOB: 1990/10/22, 26 y.o.   MRN: [id number] Per State regulations 482.30 this chart was reviewed for medical necessity with respect to the patient's admission/duration of stay.    Next review date: 11/01/16  Thurman CoyerEric Lawayne Hartig, BSN, RN-BC  Case Manager

## 2016-10-28 NOTE — Progress Notes (Signed)
Nursing Progress Note: 7p-7a D: Pt currently presents with a flat/depressed affect and behavior. Pt states "I have no idea why I am here. Nothing is wrong with me." Interacting appropriately with milieu. Pt reports off and on sleep during the previous night with current medication regimen.   A: Pt provided with medications per providers orders. Pt's labs and vitals were monitored throughout the night. Pt supported emotionally and encouraged to express concerns and questions. Pt educated on medications.  R: Pt's safety ensured with 15 minute and environmental checks. Pt currently denies SI, HI, and AVH. Pt verbally contracts to seek staff if SI,HI, or AVH occurs and to consult with staff before acting on any harmful thoughts. Will continue to monitor.

## 2016-10-28 NOTE — Progress Notes (Signed)
Adult Psychoeducational Group Note  Date:  10/28/2016 Time:  8:50 PM  Group Topic/Focus:  Wrap-Up Group:   The focus of this group is to help patients review their daily goal of treatment and discuss progress on daily workbooks.  Participation Level:  Active  Participation Quality:  Appropriate  Affect:  Appropriate  Cognitive:  Appropriate  Insight: Appropriate  Engagement in Group:  Engaged  Modes of Intervention:  Discussion  Additional Comments:  The patient expressed that he rates today a 5.The patient also said that he attended group.  Octavio Mannshigpen, Ciara Kagan Davee 10/28/2016, 8:50 PM

## 2016-10-28 NOTE — ED Provider Notes (Signed)
[facility] will [facility]    Samuel JesterMcManus, Brigida Scotti, DO 10/28/16 1447

## 2016-10-28 NOTE — ED Notes (Signed)
Meal given

## 2016-10-28 NOTE — Progress Notes (Signed)
CSW attempted to contact patient's mother, Buelah ManisJanice Yancey ([phone] to gain collateral.  There was no answer, HIPAA compliant voicemail left.   CSW will continue to follow and attempt to call again at a later time.   Baldo DaubJolan Michi Herrmann MSW, LCSWA CSW Disposition [phone]

## 2016-10-28 NOTE — Progress Notes (Signed)
Per [name] , [name], [name] ; Accepting provider is Nira ConnJason Berry, NP; Attending provider is Dr. Elna BreslowEappen.   Patient can arrive at 3:00pm. Number for report is [phone].   Docia BarrierMeagan Mitchell, RN notified.   Baldo DaubJolan Sahmya Arai MSW, LCSWA CSW Disposition [phone]

## 2016-10-28 NOTE — Tx Team (Signed)
Initial Treatment Plan 10/28/2016 6:05 PM Slater Kara PacerD Jennette ZOX:[id number]    PATIENT STRESSORS: Financial difficulties Medication change or noncompliance   PATIENT STRENGTHS: Wellsite geologistCommunication skills General fund of knowledge Motivation for treatment/growth Physical Health Supportive family/friends   PATIENT IDENTIFIED PROBLEMS: Psychosis  "Let me do a better job"  Open up and get the help I need"  "Know how to tell things to people."               DISCHARGE CRITERIA:  Improved stabilization in mood, thinking, and/or behavior Verbal commitment to aftercare and medication compliance  PRELIMINARY DISCHARGE PLAN: Outpatient therapy Medication management  PATIENT/FAMILY INVOLVEMENT: This treatment plan has been presented to and reviewed with the patient, Azhar Kara Pacer Djordjevic.  The patient and family have been given the opportunity to ask questions and make suggestions.  Levin BaconHeather V Leatta Alewine, RN 10/28/2016, 6:05 PM

## 2016-10-28 NOTE — ED Notes (Signed)
Lawson FiscalLori, Pharmacist notified that invega was not in pyxix, will send someone down with medication soon.

## 2016-10-28 NOTE — Progress Notes (Signed)
[name] is a 26 year old male being admitted voluntarily to [address]-2 from [facility].  He came to the ED with auditory hallucinations.  Mother reported that he had a recent medication change and has been yelling, shouting and talking to people that aren't there.  [facility]  He is diagnosed with Schizoaffective Disorder, Bipolar type.  No medical history.He currently denies any A/V hallucinations.  He denies suicidal or homicidal ideation.  He reported that they come and go.  Oriented him to the unit.  Admission paperwork completed and signed.  Belongings searched and secured in locker #  38.  Skin assessment completed and no skin issues noted.  Q 15 minute checks initiated for safety.  We will monitor the progress towards his goals.

## 2016-10-29 DIAGNOSIS — R45851 Suicidal ideations: Secondary | ICD-10-CM

## 2016-10-29 DIAGNOSIS — R443 Hallucinations, unspecified: Secondary | ICD-10-CM

## 2016-10-29 DIAGNOSIS — F17210 Nicotine dependence, cigarettes, uncomplicated: Secondary | ICD-10-CM

## 2016-10-29 DIAGNOSIS — F250 Schizoaffective disorder, bipolar type: Principal | ICD-10-CM

## 2016-10-29 MED ORDER — TRAZODONE HCL 100 MG PO TABS
100.0000 mg | ORAL_TABLET | Freq: Every evening | ORAL | Status: DC | PRN
  Administered 2016-10-30: 100 mg via ORAL
  Filled 2016-10-29: qty 1

## 2016-10-29 NOTE — H&P (Signed)
Psychiatric Admission Assessment Adult  Patient Identification: [name] MRN:  [id number] Date of Evaluation:  10/29/2016 Chief Complaint:  SCHIZOAFFECTIVE DISORDER,BIPOLAR TYPE Principal Diagnosis: Schizoaffective disorder, bipolar type ([place]) Diagnosis:   Patient Active Problem List   Diagnosis Date Noted  . Schizoaffective disorder, bipolar type ([place]) [F25.0] 10/25/2011  . TEAR A C L [S83.509A] 01/21/2008  . SUBLUXATION PATELLAR (MALALIGNMENT) [M24.469] 12/17/2007  . PATELLAR DISLOCATION, LEFT [S83.006A] 12/17/2007   History of Present Illness: patient is 26 year old African-American man who was admitted due to worsening of the symptom.  He is reporting auditory hallucination, paranoia, having weird thoughts.  2 weeks ago he went to see psychiatrist and given medication and since then he's noticed change in his behavior.  As per chart he was notice yelling and shouting in the bathroom and is talking to the people who were not there.  He also endorse having thoughts to killing people.  In the emergency room he appears very manic disoriented and about to have mental breakdown.  In the unit he remains very paranoid, tangential, delusional and complaining of poor sleep.  He lives with his mother and nephew but admitted having issues with them.  He is not sure what medicine was given by psychiatrist.  However he remember in the past he had a good response with Seroquel. Patient lithium level is 0.06) he noncompliant with medication.  Patient has history of previous suicidal attempt and inpatient psychiatric treatment in 2015.  He was discharged on Risperdal and lithium. Associated Signs/Symptoms: Depression Symptoms:  depressed mood, psychomotor agitation, fatigue, feelings of worthlessness/guilt, difficulty concentrating, hopelessness, recurrent thoughts of death, suicidal thoughts without plan, anxiety, (Hypo) Manic Symptoms:  Delusions, Elevated Mood, Flight of  Ideas, Hallucinations, Impulsivity, Irritable Mood, Labiality of Mood, Anxiety Symptoms:  Social Anxiety, Psychotic Symptoms:  Delusions, Hallucinations: Auditory Ideas of Reference, Paranoia, PTSD Symptoms: NA Total Time spent with patient: 45 minutes  Past Psychiatric History: patient has history of psychiatric inpatient treatment in 2015.  He was diagnosed with schizoaffective disorder and discharged on lithium and Risperdal.  He's been following up at [facility] [place]  Is the patient at risk to self? Yes.    Has the patient been a risk to self in the past 6 months? Yes.    Has the patient been a risk to self within the distant past? No.  Is the patient a risk to others? No.  Has the patient been a risk to others in the past 6 months? No.  Has the patient been a risk to others within the distant past? Yes.     Prior Inpatient Therapy:   Prior Outpatient Therapy:    Alcohol Screening: 1. How often do you have a drink containing alcohol?: Monthly or less 2. How many drinks containing alcohol do you have on a typical day when you are drinking?: 1 or 2 3. How often do you have six or more drinks on one occasion?: Never Preliminary Score: 0 9. Have you or someone else been injured as a result of your drinking?: No 10. Has a relative or friend or a doctor or another health worker been concerned about your drinking or suggested you cut down?: No Alcohol Use Disorder Identification Test Final Score (AUDIT): 1 Brief Intervention: AUDIT score less than 7 or less-screening does not suggest unhealthy drinking-brief intervention not indicated Substance Abuse History in the last 12 months:  No. Consequences of Substance Abuse: NA Previous Psychotropic Medications: Yes  Psychological Evaluations: No  Past Medical History:  Past Medical History:  Diagnosis Date  . Bipolar 1 disorder [facility])     Past Surgical History:  Procedure Laterality Date  . KNEE ARTHROSCOPY    . KNEE SURGERY      Family History: History reviewed. No pertinent family history. Family Psychiatric  History: reviewed. Tobacco Screening: Have you used any form of tobacco in the last 30 days? (Cigarettes, Smokeless Tobacco, Cigars, and/or Pipes): Yes Tobacco use, Select all that apply: 5 or more cigarettes per day Are you interested in Tobacco Cessation Medications?: No, patient refused Counseled patient on smoking cessation including recognizing danger situations, developing coping skills and basic information about quitting provided: Yes Social History:  History  Alcohol Use No     History  Drug Use No    Additional Social History:                           Allergies:  No Known Allergies Lab Results:  Results for orders placed or performed during the hospital encounter of 10/27/16 (from the past 48 hour(s))  Urine rapid drug screen (hosp performed)     Status: None   Collection Time: 10/27/16  6:50 PM  Result Value Ref Range   Opiates NONE DETECTED NONE DETECTED   Cocaine NONE DETECTED NONE DETECTED   Benzodiazepines NONE DETECTED NONE DETECTED   Amphetamines NONE DETECTED NONE DETECTED   Tetrahydrocannabinol NONE DETECTED NONE DETECTED   Barbiturates NONE DETECTED NONE DETECTED    Comment:        DRUG SCREEN FOR MEDICAL PURPOSES ONLY.  IF CONFIRMATION IS NEEDED FOR ANY PURPOSE, NOTIFY LAB WITHIN 5 DAYS.        LOWEST DETECTABLE LIMITS FOR URINE DRUG SCREEN Drug Class       Cutoff (ng/mL) Amphetamine      1000 Barbiturate      200 Benzodiazepine   563 Tricyclics       149 Opiates          300 Cocaine          300 THC              50   Comprehensive metabolic panel     Status: Abnormal   Collection Time: 10/27/16  7:23 PM  Result Value Ref Range   Sodium 137 135 - 145 mmol/L   Potassium 3.9 3.5 - 5.1 mmol/L   Chloride 106 101 - 111 mmol/L   CO2 24 22 - 32 mmol/L   Glucose, Bld 91 65 - 99 mg/dL   BUN 19 6 - 20 mg/dL   Creatinine, Ser 1.43 (H) 0.61 - 1.24 mg/dL    Calcium 9.4 8.9 - 10.3 mg/dL   Total Protein 6.9 6.5 - 8.1 g/dL   Albumin 4.3 3.5 - 5.0 g/dL   AST 22 15 - 41 U/L   ALT 29 17 - 63 U/L   Alkaline Phosphatase 55 38 - 126 U/L   Total Bilirubin 0.5 0.3 - 1.2 mg/dL   GFR calc non Af Amer >60 >60 mL/min   GFR calc Af Amer >60 >60 mL/min    Comment: (NOTE) The eGFR has been calculated using the CKD EPI equation. This calculation has not been validated in all clinical situations. eGFR's persistently <60 mL/min signify possible Chronic Kidney Disease.    Anion gap 7 5 - 15  Ethanol     Status: None   Collection Time: 10/27/16  7:23 PM  Result Value Ref Range  Alcohol, Ethyl (B) <5 <5 mg/dL    Comment:        LOWEST DETECTABLE LIMIT FOR SERUM ALCOHOL IS 5 mg/dL FOR MEDICAL PURPOSES ONLY   CBC with Diff     Status: Abnormal   Collection Time: 10/27/16  7:23 PM  Result Value Ref Range   WBC 9.9 4.0 - 10.5 K/uL   RBC 4.28 4.22 - 5.81 MIL/uL   Hemoglobin 12.5 (L) 13.0 - 17.0 g/dL   HCT 37.1 (L) 39.0 - 52.0 %   MCV 86.7 78.0 - 100.0 fL   MCH 29.2 26.0 - 34.0 pg   MCHC 33.7 30.0 - 36.0 g/dL   RDW 13.9 11.5 - 15.5 %   Platelets 211 150 - 400 K/uL   Neutrophils Relative % 68 %   Neutro Abs 6.7 1.7 - 7.7 K/uL   Lymphocytes Relative 22 %   Lymphs Abs 2.2 0.7 - 4.0 K/uL   Monocytes Relative 9 %   Monocytes Absolute 0.9 0.1 - 1.0 K/uL   Eosinophils Relative 1 %   Eosinophils Absolute 0.1 0.0 - 0.7 K/uL   Basophils Relative 0 %   Basophils Absolute 0.0 0.0 - 0.1 K/uL  Lithium level     Status: Abnormal   Collection Time: 10/27/16  7:23 PM  Result Value Ref Range   Lithium Lvl <0.06 (L) 0.60 - 1.20 mmol/L    Blood Alcohol level:  Lab Results  Component Value Date   ETH <5 10/27/2016   ETH <11 25/95/6387    Metabolic Disorder Labs:  Lab Results  Component Value Date   HGBA1C  07/08/2009    5.7 (NOTE) The ADA recommends the following therapeutic goal for glycemic control related to Hgb A1c measurement: Goal of therapy: <6.5  Hgb A1c  Reference: American Diabetes Association: Clinical Practice Recommendations 2010, Diabetes Care, 2010, 33: (Suppl  1).   MPG 117 07/08/2009   No results found for: PROLACTIN Lab Results  Component Value Date   CHOL  07/08/2009    125        ATP III CLASSIFICATION:  <200     mg/dL   Desirable  200-239  mg/dL   Borderline High  >=240    mg/dL   High          TRIG 61 07/08/2009   HDL 46 07/08/2009   CHOLHDL 2.7 07/08/2009   VLDL 12 07/08/2009   LDLCALC  07/08/2009    67        Total Cholesterol/HDL:CHD Risk Coronary Heart Disease Risk Table                     Men   Women  1/2 Average Risk   3.4   3.3  Average Risk       5.0   4.4  2 X Average Risk   9.6   7.1  3 X Average Risk  23.4   11.0        Use the calculated Patient Ratio above and the CHD Risk Table to determine the patient's CHD Risk.        ATP III CLASSIFICATION (LDL):  <100     mg/dL   Optimal  100-129  mg/dL   Near or Above                    Optimal  130-159  mg/dL   Borderline  160-189  mg/dL   High  >190     mg/dL  Very High    Current Medications: Current Facility-Administered Medications  Medication Dose Route Frequency Provider Last Rate Last Dose  . acetaminophen (TYLENOL) tablet 650 mg  650 mg Oral Q6H PRN Okonkwo, Justina A, NP      . alum & mag hydroxide-simeth (MAALOX/MYLANTA) 200-200-20 MG/5ML suspension 30 mL  30 mL Oral Q4H PRN Okonkwo, Justina A, NP      . citalopram (CELEXA) tablet 20 mg  20 mg Oral Daily Okonkwo, Justina A, NP   20 mg at 10/29/16 1031  . hydrOXYzine (ATARAX/VISTARIL) tablet 25 mg  25 mg Oral TID PRN Beryle Lathe, Justina A, NP      . lithium carbonate (ESKALITH) CR tablet 900 mg  900 mg Oral QHS Okonkwo, Justina A, NP   900 mg at 10/28/16 2214  . magnesium hydroxide (MILK OF MAGNESIA) suspension 30 mL  30 mL Oral Daily PRN Okonkwo, Justina A, NP      . mirtazapine (REMERON) tablet 15 mg  15 mg Oral QHS Okonkwo, Justina A, NP   15 mg at 10/28/16 2214  . nicotine  (NICODERM CQ - dosed in mg/24 hours) patch 21 mg  21 mg Transdermal Daily Eappen, Saramma, MD      . paliperidone (INVEGA) 24 hr tablet 6 mg  6 mg Oral QAC breakfast Okonkwo, Justina A, NP   6 mg at 10/29/16 1031  . risperiDONE (RISPERDAL) tablet 4 mg  4 mg Oral QHS Okonkwo, Justina A, NP   4 mg at 10/28/16 2214  . [START ON 10/31/2016] risperiDONE microspheres (RISPERDAL CONSTA) injection 37.5 mg  37.5 mg Intramuscular Q14 Days Okonkwo, Justina A, NP      . traZODone (DESYREL) tablet 100 mg  100 mg Oral QHS Okonkwo, Justina A, NP   100 mg at 10/28/16 2214   PTA Medications: Prescriptions Prior to Admission  Medication Sig Dispense Refill Last Dose  . citalopram (CELEXA) 20 MG tablet Take 1 tablet (20 mg total) by mouth daily. For depression and anxiety. 30 tablet 0   . lithium carbonate (ESKALITH) 450 MG CR tablet Take 900 mg by mouth at bedtime.    03/15/2011  . mirtazapine (REMERON) 15 MG tablet Take 15 mg by mouth at bedtime.     03/15/2011  . paliperidone (INVEGA) 6 MG 24 hr tablet Take 6 mg by mouth every morning.     03/16/2011  . risperiDONE (RISPERDAL) 4 MG tablet Take 1 tablet (4 mg total) by mouth at bedtime. For psychosis and mood stabilization. 30 tablet 0   . risperiDONE microspheres (RISPERDAL CONSTA) 37.5 MG injection Inject 2 mLs (37.5 mg total) into the muscle every 14 (fourteen) days. For psychosis. 1 each    . traZODone (DESYREL) 100 MG tablet Take 100 mg by mouth at bedtime.     03/15/2011    Musculoskeletal: Strength & Muscle Tone: within normal limits Gait & Station: normal Patient leans: N/A  Psychiatric Specialty Exam: Physical Exam  HENT:  Head: Normocephalic.  Eyes: Pupils are equal, round, and reactive to light.  Neck: Normal range of motion.  Cardiovascular: Normal rate.   Musculoskeletal: Normal range of motion.  Skin: Skin is warm.    Review of Systems  Constitutional: Negative.   Eyes: Negative.   Respiratory: Negative.   Gastrointestinal: Negative.    Genitourinary: Negative.   Skin: Negative.   Neurological: Negative.   Psychiatric/Behavioral: Positive for depression, hallucinations and suicidal ideas. The patient is nervous/anxious and has insomnia.     Blood pressure 140/86, pulse Marland Kitchen)  119, temperature 98.5 F (36.9 C), temperature source Oral, resp. rate 18, height _0  (1.702 m), weight 100.7 kg (222 lb).Body mass index is 34.77 kg/m.  General Appearance: Guarded  Eye Contact:  Fair  Speech:  fast and rambling  Volume:  Normal  Mood:  Irritable  Affect:  Labile  Thought Process:  Descriptions of Associations: Circumstantial  Orientation:  Full (Time, Place, and Person)  Thought Content:  Hallucinations: Auditory, Paranoid Ideation, Rumination and Tangential  Suicidal Thoughts:  Yes.  without intent/plan  Homicidal Thoughts:  No  Memory:  Immediate;   Fair Recent;   Fair Remote;   Fair  Judgement:  Impaired  Insight:  Lacking  Psychomotor Activity:  Increased  Concentration:  Concentration: Fair and Attention Span: Fair  Recall:  [employer] of Knowledge:  Fair  Language:  Fair  Akathisia:  No  Handed:  Right  AIMS (if indicated):     Assets:  Desire for Improvement Housing  ADL's:  Intact  Cognition:  WNL  Sleep:  Number of Hours: 6.75    Treatment Plan Summary: Daily contact with patient to assess and evaluate symptoms and progress in treatment and Medication management  Observation Level/Precautions:  Elopement  Laboratory:  CBC Chemistry Profile HbAIC HCG UDS UA  Psychotherapy:    Medications:    Consultations:    Discharge Concerns:    Estimated LOS:  Other:     Physician Treatment Plan for Primary Diagnosis: Schizoaffective disorder, bipolar type ([place]) Long Term Goal(s): Improvement in symptoms so as ready for discharge  Short Term Goals: Ability to identify changes in lifestyle to reduce recurrence of condition will improve, Ability to verbalize feelings will improve, Ability to disclose and  discuss suicidal ideas and Ability to demonstrate self-control will improve  Physician Treatment Plan for Secondary Diagnosis: Active Problems:   Schizoaffective disorder, bipolar type ([place])  Long Term Goal(s): Improvement in symptoms so as ready for discharge  Short Term Goals: Ability to demonstrate self-control will improve, Ability to identify and develop effective coping behaviors will improve, Ability to maintain clinical measurements within normal limits will improve, Compliance with prescribed medications will improve and Ability to identify triggers associated with substance abuse/mental health issues will improve  Medication management to reduce symptoms to baseline and improved the patient's overall level of functioning.  We will start Celexa 20 mg daily, Atarax 25 mg for anxiety.  Patient is getting Risperdal 4 mg at bedtime and he will get Risperdal Consta 37.5 mg intramuscular on July 30.  We will also provide trazodone 100 mg at bedtime.  Closely monitor the side effects, efficacy and therapeutic response of medication. Treat health problem as indicated. Developed treatment plan to decrease the risk of relapse upon discharge and to reduce the need for readmission. Psychosocial education regarding relapse prevention in self-care. Healthcare followup as needed for medical problems and called consults as indicated.   Increase collateral information. Restart home medication where appropriate Encouraged to participate and verbalize into group milieu therapy.    I certify that inpatient services furnished can reasonably be expected to improve the patient's condition.    Kyrstal Monterrosa T., MD 7/28/20182:37 PM

## 2016-10-29 NOTE — Progress Notes (Signed)
D.  Pt pleasant, but labile on approach.  He is appropriate at the medication window and in conversation and then goes to his room and shouts, loud, disruptive.  Pt denies SI/HI/AVH at this time.  A.  Support and encouragement offered, medication given as ordered  R.  Pt remains safe on the unit, will continue to monitor.

## 2016-10-29 NOTE — [facility] Suicide Risk Assessment (Signed)
[facility]   Nursing information obtained from:  Patient Demographic factors:  Male, Adolescent or young adult Current Mental Status:  NA Loss Factors:  Financial problems / change in socioeconomic status Historical Factors:  NA Risk Reduction Factors:  Living with another person, especially a relative  Total Time spent with patient: 45 minutes Principal Problem: <principal problem not specified> Diagnosis:   Patient Active Problem List   Diagnosis Date Noted  . Schizoaffective disorder, bipolar type ([facility]) [F25.0] 10/25/2011  . TEAR A C L [S83.509A] 01/21/2008  . SUBLUXATION PATELLAR (MALALIGNMENT) [M24.469] 12/17/2007  . PATELLAR DISLOCATION, LEFT [S83.006A] 12/17/2007   Subjective Data: patient is 26 year old African-American man with a history of schizoaffective disorder admitted due to having auditory hallucination and having increase in his symptoms for past few weeks.  Apparently patient has given a new medication by his psychiatrist but he started to have increased racing thoughts, hallucination, poor sleep and he was notice yelling and shouting on the bathroom. He appears paranoid and endorse having hallucination.  Patient requires inpatient treatment.  Please see history and physical for more detailed information  Continued Clinical Symptoms:  Alcohol Use Disorder Identification Test Final Score (AUDIT): 1 The "Alcohol Use Disorders Identification Test", Guidelines for Use in Primary Care, Second Edition.  World [employer] [facility](WHO). Score between 0-7:  no or low risk or alcohol related problems. Score between 8-15:  moderate risk of alcohol related problems. Score between 16-19:  high risk of alcohol related problems. Score 20 or above:  warrants further diagnostic evaluation for alcohol dependence and treatment.   CLINICAL FACTORS:   Schizophrenia:   Command hallucinatons Less than 26 years old Paranoid or undifferentiated type More than  one psychiatric diagnosis Unstable or Poor Therapeutic Relationship Previous Psychiatric Diagnoses and Treatments   Musculoskeletal: Strength & Muscle Tone: within normal limits Gait & Station: normal Patient leans: N/A  Psychiatric Specialty Exam: Physical Exam  ROS  Blood pressure 140/86, pulse (!) 119, temperature 98.5 F (36.9 C), temperature source Oral, resp. rate 18, height 5\' 7"  (1.702 m), weight 100.7 kg (222 lb).Body mass index is 34.77 kg/m.   Please see complete mental status examination on history and physical.  COGNITIVE FEATURES THAT CONTRIBUTE TO RISK:  Closed-mindedness, Loss of executive function and Polarized thinking    SUICIDE RISK:   Moderate:  Frequent suicidal ideation with limited intensity, and duration, some specificity in terms of plans, no associated intent, good self-control, limited dysphoria/symptomatology, some risk factors present, and identifiable protective factors, including available and accessible social support.  PLAN OF CARE: Patient requires inpatient treatment and stabilization.  We will start Seroquel which helped her in the past.  Please see history and physical for more detailed information.  I certify that inpatient services furnished can reasonably be expected to improve the patient's condition.   Zeidy Tayag T., MD 10/29/2016, 2:33 PM

## 2016-10-29 NOTE — B[facility] Counselor (Signed)
Clinical Social Work Note  Started Psychosocial Assessment with pt, but he was too disorganized and then became agitated.  To try again tomorrow.  Ambrose MantleMareida Grossman-Orr, LCSW 10/29/2016, 2:35 PM

## 2016-10-29 NOTE — [facility] Group Notes (Signed)
[facility] Group Notes:  (Nursing/MHT/Case Management/Adjunct)  Date:  10/29/2016  Time:  1:08 PM  Type of Therapy:  Psychoeducational Skills  Participation Level:  Active  Participation Quality:  Intrusive and Monopolizing  Affect:  Anxious  Cognitive:  Disorganized  Insight:  Lacking  Engagement in Group:  Monopolizing  Modes of Intervention:  Problem-solving  Summary of Progress/Problems: Pt attended patient self inventory group.  Jacquelyne BalintForrest, Kalaysia Demonbreun Shanta 10/29/2016, 1:08 PM

## 2016-10-29 NOTE — Progress Notes (Signed)
Adult Psychoeducational Group Note  Date:  10/29/2016 Time:  9:13 PM  Group Topic/Focus:  Wrap-Up Group:   The focus of this group is to help patients review their daily goal of treatment and discuss progress on daily workbooks.  Participation Level:  Active  Participation Quality:  Appropriate  Affect:  Appropriate  Cognitive:  Appropriate  Insight: Appropriate  Engagement in Group:  Engaged  Modes of Intervention:  Discussion  Additional Comments: The patient expressed that he rates today 10.  [name], [name] 10/29/2016, 9:13 PM

## 2016-10-29 NOTE — Progress Notes (Signed)
Patient ID: [name], male   DOB: 09-Jan-1991, 26 y.o.   MRN: [id number]    D: Pt has been very bizarre on the unit today. Pt did attend all groups, he was very tangential. Pt was also very disorganized in his thinking. Pt did have to be redirected in group. Pt reported that his depression was a 0, his hopelessness was a 0, and his anxiety was a 9. Pt reported being negative SI/HI, no AH/VH noted. A: 15 min checks continued for patient safety. R: Pt safety maintained.

## 2016-10-29 NOTE — [facility] Group Notes (Signed)
[facility] Group Notes:  (Clinical Social Work)  10/29/2016  11:15-12:00PM  Summary of Progress/Problems:   Today's process group involved patients discussing their feelings related to being hospitalized, as well as benefits they see to being in the hospital.  These were itemized and then the group brainstormed specific ways in which they could seek for those same benefits to happen when they discharge and go back home. The patient expressed a primary feeling about being hospitalized is "it's a disaster, someone ruined the good days, this place will make people lose their minds."  He then was appropriate in answering questions and offering suggestions for the remainder of group.  Type of Therapy:  Group Therapy - Process  Participation Level:  Active  Participation Quality:  Attentive, Sharing and Supportive  Affect:  Blunted  Cognitive:  Disorganized  Insight:  Improving  Engagement in Therapy:  Improving  Modes of Intervention:  Exploration, Discussion  Ambrose MantleMareida Grossman-Orr, LCSW 10/29/2016, 12:43 PM

## 2016-10-30 MED ORDER — DIVALPROEX SODIUM ER 250 MG PO TB24
250.0000 mg | ORAL_TABLET | Freq: Two times a day (BID) | ORAL | Status: DC
  Administered 2016-10-30 – 2016-11-03 (×8): 250 mg via ORAL
  Filled 2016-10-30 (×12): qty 1

## 2016-10-30 MED ORDER — LORAZEPAM 1 MG PO TABS: 2.0000 mg | ORAL_TABLET | Freq: Once | ORAL | Status: AC

## 2016-10-30 MED ORDER — LORAZEPAM 2 MG/ML IJ SOLN: 2.0000 mg | Freq: Once | INTRAMUSCULAR | Status: AC

## 2016-10-30 MED ORDER — LORAZEPAM 2 MG/ML IJ SOLN
INTRAMUSCULAR | Status: AC
  Administered 2016-10-30: 02:00:00
  Filled 2016-10-30: qty 1

## 2016-10-30 MED ORDER — LORAZEPAM 1 MG PO TABS: 2.0000 mg | ORAL_TABLET | Freq: Once | ORAL | Status: DC | PRN

## 2016-10-30 MED ORDER — LORAZEPAM 2 MG/ML IJ SOLN
2.0000 mg | Freq: Once | INTRAMUSCULAR | Status: DC | PRN
  Filled 2016-10-30 (×2): qty 1

## 2016-10-30 NOTE — [facility] Group Notes (Signed)
[facility] Group Notes:  (Clinical Social Work)  10/30/2016  11:00AM-12:00PM  Summary of Progress/Problems:  The main focus of today's process group was to listen to a variety of genres of music and to identify that different types of music provoke different responses.  The patient then was able to identify personally what was soothing for them, as well as energizing, as well as how patient can personally use this knowledge in sleep habits, with depression, and with other symptoms.  The patient was not present at the beginning of group, but came in about 15 minutes after the start.  He was asked how he was feeling overall today, and his response was unintelligible/rambling.  He sang along with some songs, was interactive, smiled and danced.  However, he was not able to share thoughts with coherence.  Type of Therapy:  Music Therapy   Participation Level:  Active  Participation Quality:  Attentive  Affect:  Blunted  Cognitive:  Disorganized and Delusional  Insight:   Improving  Engagement in Therapy:  Improving  Modes of Intervention:   Activity, Exploration  Ambrose MantleMareida Grossman-Orr, LCSW 10/30/2016

## 2016-10-30 NOTE — Progress Notes (Signed)
DAR NOTE Pt present with flat affect and depressed mood in the unit. Pt spent most of his am in bed, came out for meds and went back to bed until lunch time. Pt has a strong urine sell , offered towel to take a shower but declined. Pt still manic and delusional on the unit. As per self inventory, pt had a fair night sleep, good appetite, normal energy, and good concentration. Pt rate depression at 0, hopeless ness at 0, and anxiety at 1. Pt's safety ensured with 15 minute and environmental checks. Pt currently denies SI/HI and A/V hallucinations. Pt verbally agrees to seek staff if SI/HI or A/VH occurs and to consult with staff before acting on these thoughts. Will continue POC.
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
## 2016-10-30 NOTE — Progress Notes (Signed)
Pt screams periodically at night.  NP notified, no new orders received.

## 2016-10-30 NOTE — Progress Notes (Signed)
D.  Pt pleasant on approach, flat expression.  Pt asked about medication changes and reasons for them.  Pt did attend evening wrap up group, minimal interaction with peers on unit, but does sit in dayroom.  Pt did have one reported episode of yelling in room, none further.  Pt denies SI/HI/AVH at this time.  A.  Support and encouragement offered.  Medication given as ordered  R.  Pt remains safe on the unit, will continue to monitor.

## 2016-10-30 NOTE — Progress Notes (Signed)
Pt began sustained screaming in room, staff responded at once.  NP notified and new order received for Ativan 2 mg.  IM given to Pt in left arm and Pt tolerated well.

## 2016-10-30 NOTE — Progress Notes (Signed)
[facility]  10/30/2016 11:18 AM [name]  MRN:  [id number] Subjective:  I did not have a good night sleep.  I got injection. Objective; patient is 26 year old African-American man who was admitted due to worsening of the symptoms.  He was having hallucination, paranoia and found to be yelling and shouting.  He was started on medication but remains very manic, delusional and grandiose.  Last night he was again shouting and yelling and screaming in the bathroom and given Ativan to calm down.  After that he had a three-hour sleep.  He remains paranoid, guarded and does not want to talk about his symptoms.  But he is going to the groups and remained seclusive.  His thought process remains tangential, delusional.  He has no tremors or shakes.  He is taking lithium, Risperdal, Celexa and Remeron.  Principal Problem: Schizoaffective disorder, bipolar type (HCC) Diagnosis:   Patient Active Problem List   Diagnosis Date Noted  . Schizoaffective disorder, bipolar type (HCC) [F25.0] 10/25/2011  . TEAR A C L [S83.509A] 01/21/2008  . SUBLUXATION PATELLAR (MALALIGNMENT) [M24.469] 12/17/2007  . PATELLAR DISLOCATION, LEFT [S83.006A] 12/17/2007   Total Time spent with patient: 20 minutes  Past Psychiatric History: Reviewed.  Past Medical History:  Past Medical History:  Diagnosis Date  . Bipolar 1 disorder [facility](HCC)     Past Surgical History:  Procedure Laterality Date  . KNEE ARTHROSCOPY    . KNEE SURGERY     Family History: History reviewed. No pertinent family history. Family Psychiatric  History: reviewed. Social History:  History  Alcohol Use No     History  Drug Use No    Social History   Social History  . Marital status: Single    Spouse name: N/A  . Number of children: N/A  . Years of education: N/A   Social History Main Topics  . Smoking status: Current Every Day Smoker    Types: Cigarettes  . Smokeless tobacco: Never Used  . Alcohol use No  . Drug use: No  .  Sexual activity: Not Asked   Other Topics Concern  . None   Social History Narrative   ** Merged History Encounter **       ** Merged History Encounter **       Additional Social History:                         Sleep: Poor  Appetite:  Fair  Current Medications: Current Facility-Administered Medications  Medication Dose Route Frequency Provider Last Rate Last Dose  . acetaminophen (TYLENOL) tablet 650 mg  650 mg Oral Q6H PRN Okonkwo, Justina A, NP      . alum & mag hydroxide-simeth (MAALOX/MYLANTA) 200-200-20 MG/5ML suspension 30 mL  30 mL Oral Q4H PRN Okonkwo, Justina A, NP      . citalopram (CELEXA) tablet 20 mg  20 mg Oral Daily Okonkwo, Justina A, NP   20 mg at 10/30/16 0827  . hydrOXYzine (ATARAX/VISTARIL) tablet 25 mg  25 mg Oral TID PRN Beryle Lathekonkwo, Justina A, NP      . lithium carbonate (ESKALITH) CR tablet 900 mg  900 mg Oral QHS Okonkwo, Justina A, NP   900 mg at 10/29/16 2035  . magnesium hydroxide (MILK OF MAGNESIA) suspension 30 mL  30 mL Oral Daily PRN Okonkwo, Justina A, NP      . mirtazapine (REMERON) tablet 15 mg  15 mg Oral QHS Okonkwo, Justina A, NP  15 mg at 10/29/16 2035  . nicotine (NICODERM CQ - dosed in mg/24 hours) patch 21 mg  21 mg Transdermal Daily Eappen, Levin BaconSaramma, MD   21 mg at 10/30/16 0827  . risperiDONE (RISPERDAL) tablet 4 mg  4 mg Oral QHS Okonkwo, Justina A, NP   4 mg at 10/29/16 2035  . [START ON 10/31/2016] risperiDONE microspheres (RISPERDAL CONSTA) injection 37.5 mg  37.5 mg Intramuscular Q14 Days Okonkwo, Justina A, NP      . traZODone (DESYREL) tablet 100 mg  100 mg Oral QHS PRN,MR X 1 Berry, Jason A, NP        Lab Results: No results found for this or any previous visit (from the past 48 hour(s)).  Blood Alcohol level:  Lab Results  Component Value Date   [facility]   ETH <11 10/24/2011    Metabolic Disorder Labs: Lab Results  Component Value Date   HGBA1C  07/08/2009    5.7 (NOTE) The ADA recommends the following  therapeutic goal for glycemic control related to Hgb A1c measurement: Goal of therapy: <6.5 Hgb A1c  Reference: American Diabetes Association: Clinical Practice Recommendations 2010, Diabetes Care, 2010, 33: (Suppl  1).   MPG 117 07/08/2009   No results found for: PROLACTIN Lab Results  Component Value Date   CHOL  07/08/2009    125        ATP III CLASSIFICATION:  <200     mg/dL   Desirable  [id number]  mg/dL   Borderline High  >=045>=240    mg/dL   High          TRIG 61 07/08/2009   HDL 46 07/08/2009   CHOLHDL 2.7 07/08/2009   VLDL 12 07/08/2009   LDLCALC  07/08/2009    67        Total Cholesterol/HDL:CHD Risk Coronary Heart Disease Risk Table                     Men   Women  1/2 Average Risk   3.4   3.3  Average Risk       5.0   4.4  2 X Average Risk   9.6   7.1  3 X Average Risk  23.4   11.0        Use the calculated Patient Ratio above and the CHD Risk Table to determine the patient's CHD Risk.        ATP III CLASSIFICATION (LDL):  <100     mg/dL   Optimal  [id number]  mg/dL   Near or Above                    Optimal  130-159  mg/dL   Borderline  [id number]  mg/dL   High  >956>190     mg/dL   Very High    Physical Findings: AIMS: Facial and Oral Movements Muscles of Facial Expression: None, normal Lips and Perioral Area: None, normal Jaw: None, normal Tongue: None, normal,Extremity Movements Upper (arms, wrists, hands, fingers): None, normal Lower (legs, knees, ankles, toes): None, normal, Trunk Movements Neck, shoulders, hips: None, normal, Overall Severity Severity of abnormal movements (highest score from questions above): None, normal Incapacitation due to abnormal movements: None, normal Patient's awareness of abnormal movements (rate only patient's report): No Awareness, Dental Status Current problems with teeth and/or dentures?: No Does patient usually wear dentures?: No  CIWA:    COWS:     Musculoskeletal: Strength & Muscle Tone: within normal  limits Gait &  Station: normal Patient leans: N/A  Psychiatric Specialty Exam: Physical Exam  Review of Systems  Constitutional: Negative.   HENT: Negative.   Respiratory: Negative.   Gastrointestinal: Negative.   Musculoskeletal: Negative.   Skin: Negative.   Psychiatric/Behavioral: Positive for hallucinations. The patient is nervous/anxious.     Blood pressure 140/86, pulse 98, temperature 98.5 F (36.9 C), temperature source Oral, resp. rate 18, height 5\' 7"  (1.702 m), weight 100.7 kg (222 lb).Body mass index is 34.77 kg/m.  General Appearance: Guarded  Eye Contact:  Fair  Speech:  Slow  Volume:  Normal  Mood:  Irritable  Affect:  Labile  Thought Process:  Descriptions of Associations: Circumstantial  Orientation:  Full (Time, Place, and Person)  Thought Content:  Delusions, Hallucinations: Auditory, Paranoid Ideation, Rumination and Tangential  Suicidal Thoughts:  No  Homicidal Thoughts:  No  Memory:  Immediate;   Fair Recent;   Fair Remote;   Fair  Judgement:  Fair  Insight:  Fair  Psychomotor Activity:  Restlessness  Concentration:  Concentration: Fair and Attention Span: Fair  Recall:  [employer] of Knowledge:  Fair  Language:  Good  Akathisia:  No  Handed:  Right  AIMS (if indicated):     Assets:  Desire for Improvement Housing  ADL's:  Intact  Cognition:  WNL  Sleep:  Number of Hours: 6.75     Treatment Plan Summary: Daily contact with patient to assess and evaluate symptoms and progress in treatment and Medication management  Reviewed blood work results. His hemoglobin A1c and lipid panel pending.  His lithium level is 0.06.  His CBC shows hemoglobin 12.5 and hematocrit 37.5. His UDS is negative.  His comprehensive metabolic panel shows creatinine 1.43. I will discontinue lithium due to high creatinine.  Continue Risperdal 4 mg at bedtime, we will start Depakote 250 mg twice a day.  discontinue Remeron 15 mg at bedtime.  Continue trazodone 100 mg at bedtime and Celexa  20 mg daily.  Continue hydroxyzine 25 mg 3 times a day for anxiety and Ativan for severe agitation. Increase collateral information from the family. Encourage to participate in group milieu therapy. Social worker on disposition planning.  Grant Bonfanti T., MD 10/30/2016, 11:18 AM

## 2016-10-31 DIAGNOSIS — F419 Anxiety disorder, unspecified: Secondary | ICD-10-CM

## 2016-10-31 DIAGNOSIS — G4700 Insomnia, unspecified: Secondary | ICD-10-CM

## 2016-10-31 DIAGNOSIS — F39 Unspecified mood [affective] disorder: Secondary | ICD-10-CM

## 2016-10-31 MED ORDER — TRAZODONE HCL 100 MG PO TABS
100.0000 mg | ORAL_TABLET | Freq: Every day | ORAL | Status: DC
  Administered 2016-10-31 – 2016-11-03 (×4): 100 mg via ORAL
  Filled 2016-10-31 (×6): qty 1

## 2016-10-31 MED ORDER — LORAZEPAM 2 MG/ML IJ SOLN
1.0000 mg | Freq: Once | INTRAMUSCULAR | Status: AC
  Administered 2016-10-31: 1 mg via INTRAVENOUS

## 2016-10-31 NOTE — Progress Notes (Signed)
Patient laying in bed with eyes closed, even non-labored respirations.  Patient did not fall asleep until 0530, requested MHT's allow patient to sleep and watch for chest rises on 15 minute checks due to lability and insomnia last night.

## 2016-10-31 NOTE — Progress Notes (Signed)
The focus of this group is to help patients review their daily goal of treatment and discuss progress on daily workbooks. Pt attended the evening group session and responded to all discussion prompts from the Writer. Pt shared that he felt today was a bad day on the unit largely due to boredom. "The clock just ticks by so slowly in here. I'm getting tired of being here."  Pt verbalized that he would continue to take his medication and comply with treatment but that he was wanting to leave soon. Pt's responses to other discussion prompts were disorganized and unrelated to the discussion.  Pt rated his day a 9 out of 10 and his affect was blunted.

## 2016-10-31 NOTE — Progress Notes (Signed)
Vitals rechecked, pulse reduced, BP stable (see flowsheet), patient has been drinking fluids consistently and eating well.

## 2016-10-31 NOTE — Progress Notes (Signed)
Recreation Therapy Notes  INPATIENT RECREATION THERAPY ASSESSMENT  Patient Details Name: [name] MRN: [id number] DOB: 1990-06-26 Today's Date: 10/31/2016  Pt reported he does not know why he was admitted.  Patient Stressors:    Coping Skills:   Isolate, Exercise, Music, Sports  Personal Challenges: Communication, Trusting Others  Leisure Interests (2+):  Music - Write music  Awareness of Community Resources:  Yes  Community Resources:  [employer], shopping  Current Use: No  If no, Barriers?: Social, Attitudinal   Pt reported he used to go to church with family, but he no longer does because his family wants everything to go a certain way.  Patient Strengths:  "good at me"  Patient Identified Areas of Improvement:  "nope"  Current Recreation Participation:  "every now and then"  Patient Goal for Hospitalization:  "get up out of here"  [place]  [place]   Current [place] (including self-harm):  No  Current HI:  No  Consent to Intern Participation: Yes  Marvell FullerRachel Meyer, Recreation Therapy Intern   Marvell FullerRachel Meyer 10/31/2016, 3:00 PM    Caroll RancherMarjette Kaleeah Gingerich, LRT/CTRS

## 2016-10-31 NOTE — Progress Notes (Signed)
Pt singing and shouting in room, stops when MHT goes into room.  NP notified and new order received for IM Ativan 1 mg.  Pt agreeable to this and tolerated well.  Pt requested all shots be in same arm because other arm is very sore.

## 2016-10-31 NOTE — Plan of Care (Signed)
Problem: Coping: Goal: Ability to cope will improve Outcome: Not Progressing Pt continues to scream and talk loudly at night due to voices

## 2016-10-31 NOTE — Progress Notes (Signed)
Pt heard talking loudly to self in room.  Pt had explained to CN earlier that he screams because the voices in his head are so loud.  Ativan 2 mg IM given and Pt agreeable and cooperative to this.  Pt tolerated well.  Will continue to monitor.

## 2016-10-31 NOTE — Progress Notes (Signed)
Recreation Therapy Notes  Date: 10/31/2016 Time: 10:00am Location: [address] Dayroom  Group Topic: Coping Skills  Goal Area(s) Addresses:  Pt will be able to successfully identify at least 6 positive coping skills.  Intervention: Art/Worksheet  Activity: Pt will identify coping skills in the following domains: self-soothing,distraction, opposite action, emotional awareness,mindfulness and crisis plan. Pt will share one coping skill from each category.  Education: PharmacologistCoping Skills, Discharge Planning  Education Outcome: Needs additional education  Clinical Observations/Feedback: Pt did not attend group.  Marvell Fullerachel Meyer, Recreation Therapy Intern  Caroll RancherMarjette Teosha Casso, LRT/CTRS

## 2016-10-31 NOTE — Plan of Care (Signed)
Problem: Activity: Goal: Interest or engagement in activities will improve Outcome: Progressing Pt has been attending groups on unit

## 2016-10-31 NOTE — Progress Notes (Signed)
Pt slept only 45 minutes tonight and this was not all together.  Pt was given Ativan injections twice with little effect.   Pt continues to sing and appear to respond to internal stimuli.

## 2016-10-31 NOTE — Progress Notes (Signed)
Nursing Note 10/31/2016 [id number]  Data Reports sleeping good.  Did not complete self inventory. Affect blunted but appropriate. Denied HI, SI.  Reports AH that tell him to hurt others "but I don't listen to them."  Patient woke up late (see other note).  Fluids encouraged, patient drank plenty of fluids and ate well this afternoon.   Action Spoke with patient 1:1, nurse offered support to patient throughout shift.  Continues to be monitored on 15 minute checks for safety.  Response Remains safe and appropriate on unit.

## 2016-10-31 NOTE — Progress Notes (Signed)
[facility]  10/31/2016 2:02 PM [name]  MRN:  [id number]  Subjective: [name] reports, "I'm not well".  Objective: [name] is seen, chart reviewed. He is lying down in his bed asleep. He is snoring heavily, however, easily aroused. He is not making any eye contact. He is verbally responsive. He says he is not well. Staff reports that he did not sleep but 45 minutes last night. He was apparently being disruptive, singing & shouting. He received some antianxiety medications this morning that kind of made him sleepy. Reports indicated that he continues to respond to some internal stimuli. Staff continue to provide monitoring & support.  Principal Problem: Schizoaffective disorder, bipolar type (HCC)  Diagnosis:   Patient Active Problem List   Diagnosis Date Noted  . Schizoaffective disorder, bipolar type (HCC) [F25.0] 10/25/2011  . TEAR A C L [S83.509A] 01/21/2008  . SUBLUXATION PATELLAR (MALALIGNMENT) [M24.469] 12/17/2007  . PATELLAR DISLOCATION, LEFT [S83.006A] 12/17/2007   Total Time spent with patient: 25 minutes  Past Psychiatric History: Schizoaffective disorder.  Past Medical History:  Past Medical History:  Diagnosis Date  . Bipolar 1 disorder [facility](HCC)     Past Surgical History:  Procedure Laterality Date  . KNEE ARTHROSCOPY    . KNEE SURGERY     Family History: History reviewed. No pertinent family history.  Family Psychiatric  History: See H&P  Social History:  History  Alcohol Use No     History  Drug Use No    Social History   Social History  . Marital status: Single    Spouse name: N/A  . Number of children: N/A  . Years of education: N/A   Social History Main Topics  . Smoking status: Current Every Day Smoker    Types: Cigarettes  . Smokeless tobacco: Never Used  . Alcohol use No  . Drug use: No  . Sexual activity: Not Asked   Other Topics Concern  . None   Social History Narrative   ** Merged History Encounter **       ** Merged  History Encounter **       Additional Social History:   Sleep: Poor  Appetite:  Fair  Current Medications: Current Facility-Administered Medications  Medication Dose Route Frequency Provider Last Rate Last Dose  . acetaminophen (TYLENOL) tablet 650 mg  650 mg Oral Q6H PRN Okonkwo, Justina A, NP      . alum & mag hydroxide-simeth (MAALOX/MYLANTA) 200-200-20 MG/5ML suspension 30 mL  30 mL Oral Q4H PRN Okonkwo, Justina A, NP      . citalopram (CELEXA) tablet 20 mg  20 mg Oral Daily Okonkwo, Justina A, NP   20 mg at 10/31/16 1211  . divalproex (DEPAKOTE ER) 24 hr tablet 250 mg  250 mg Oral q12n4p Arfeen, Syed T, MD   250 mg at 10/31/16 1211  . hydrOXYzine (ATARAX/VISTARIL) tablet 25 mg  25 mg Oral TID PRN Beryle Lathekonkwo, Justina A, NP      . LORazepam (ATIVAN) tablet 2 mg  2 mg Oral Once PRN Jackelyn PolingBerry, Jason A, NP       Or  . LORazepam (ATIVAN) injection 2 mg  2 mg Intramuscular Once PRN Nira ConnBerry, Jason A, NP      . magnesium hydroxide (MILK OF MAGNESIA) suspension 30 mL  30 mL Oral Daily PRN Okonkwo, Justina A, NP      . risperiDONE (RISPERDAL) tablet 4 mg  4 mg Oral QHS Okonkwo, Justina A, NP   4 mg at 10/30/16  2056  . risperiDONE microspheres (RISPERDAL CONSTA) injection 37.5 mg  37.5 mg Intramuscular Q14 Days Okonkwo, Justina A, NP   37.5 mg at 10/31/16 1213  . traZODone (DESYREL) tablet 100 mg  100 mg Oral QHS Adiah Guereca I, NP       Lab Results: No results found for this or any previous visit (from the past 48 hour(s)).  Blood Alcohol level:  Lab Results  Component Value Date   [facility] <5 10/27/2016   ETH <11 10/24/2011   Metabolic Disorder Labs: Lab Results  Component Value Date   HGBA1C  07/08/2009    5.7 (NOTE) The ADA recommends the following therapeutic goal for glycemic control related to Hgb A1c measurement: Goal of therapy: <6.5 Hgb A1c  Reference: American Diabetes Association: Clinical Practice Recommendations 2010, Diabetes Care, 2010, 33: (Suppl  1).   MPG 117 07/08/2009   No  results found for: PROLACTIN Lab Results  Component Value Date   CHOL  07/08/2009    125        ATP III CLASSIFICATION:  <200     mg/dL   Desirable  960-454  mg/dL   Borderline High  >=098    mg/dL   High          TRIG 61 07/08/2009   HDL 46 07/08/2009   CHOLHDL 2.7 07/08/2009   VLDL 12 07/08/2009   LDLCALC  07/08/2009    67        Total Cholesterol/HDL:CHD Risk Coronary Heart Disease Risk Table                     Men   Women  1/2 Average Risk   3.4   3.3  Average Risk       5.0   4.4  2 X Average Risk   9.6   7.1  3 X Average Risk  23.4   11.0        Use the calculated Patient Ratio above and the CHD Risk Table to determine the patient's CHD Risk.        ATP III CLASSIFICATION (LDL):  <100     mg/dL   Optimal  119-147  mg/dL   Near or Above                    Optimal  130-159  mg/dL   Borderline  829-562  mg/dL   High  >130     mg/dL   Very High   Physical Findings: AIMS: Facial and Oral Movements Muscles of Facial Expression: None, normal Lips and Perioral Area: None, normal Jaw: None, normal Tongue: None, normal,Extremity Movements Upper (arms, wrists, hands, fingers): None, normal Lower (legs, knees, ankles, toes): None, normal, Trunk Movements Neck, shoulders, hips: None, normal, Overall Severity Severity of abnormal movements (highest score from questions above): None, normal Incapacitation due to abnormal movements: None, normal Patient's awareness of abnormal movements (rate only patient's report): No Awareness, Dental Status Current problems with teeth and/or dentures?: No Does patient usually wear dentures?: No  CIWA:    COWS:     Musculoskeletal: Strength & Muscle Tone: within normal limits Gait & Station: normal Patient leans: N/A  Psychiatric Specialty Exam: Physical Exam  Review of Systems  Psychiatric/Behavioral: Positive for depression and hallucinations. Negative for memory loss, substance abuse and suicidal ideas. The patient is  nervous/anxious and has insomnia.     Blood pressure 140/86, pulse 98, temperature 98.5 F (36.9 C), temperature source Oral, resp. rate  18, height 5\' 7"  (1.702 m), weight 100.7 kg (222 lb).Body mass index is 34.77 kg/m.  General Appearance: Guarded  Eye Contact:  Fair  Speech:  fast and rambling  Volume:  Normal  Mood:  Irritable  Affect:  Labile  Thought Process:  Descriptions of Associations: Circumstantial  Orientation:  Full (Time, Place, and Person)  Thought Content:  Hallucinations: Auditory, Paranoid Ideation, Rumination and Tangential  Suicidal Thoughts:  Yes.  without intent/plan  Homicidal Thoughts:  No  Memory:  Immediate;   Fair Recent;   Fair Remote;   Fair  Judgement:  Impaired  Insight:  Lacking  Psychomotor Activity:  Increased  Concentration:  Concentration: Fair and Attention Span: Fair  Recall:  FiservFair  Fund of Knowledge:  Fair  Language:  Fair  Akathisia:  No  Handed:  Right  AIMS (if indicated):     Assets:  Desire for Improvement Housing  ADL's:  Intact  Cognition:  WNL  Sleep:  Number of Hours: 45 minutes     Treatment Plan Summary: Patient is not at his baseline, yet. There is evidence of psychosis (paranoia, hallucinations). He continues to require mood stabilization treatments.   Psychiatric: Schizoaffective disorder  Medical: Will continue monitor for any symptoms & treat on a prn basis.  Psychosocial:  Unemployed  Will encourage group counseling attendance & participation.  PLAN: 1.10-31-16: No changes made on the current plan of care, continue current regimen as recommended.  Anxiety: Hydroxyzine 25 mg prn Q 6 hours.  Depression: Continue Citalopram 20 mg daily.  Mood control: Continue Risperdal 4 mg Q bedtime.  Continue Risperdal Consta 37.5 mg Q 14 days mood control.  Mood Stabilization:  Continue Depakote ER 250 mg bid.  Insomnia: Continue Trazodone 100 mg Q bedtime.  2. Continue to monitor mood, behavior and  interaction with peers  Armandina StammerNwoko, Yitzel Shasteen I, NP, PMHNP, FNP-BC. 10/31/2016, 2:02 PM

## 2016-10-31 NOTE — [facility] Group Notes (Signed)
[facility] LCSW Group Therapy  10/31/2016 1:15 pm  Type of Therapy: Process Group Therapy  Participation Level:  Active  Participation Quality:  Appropriate  Affect:  Flat  Cognitive:  Oriented  Insight:  Improving  Engagement in Group:  Limited  Engagement in Therapy:  Limited  Modes of Intervention:  Activity, Clarification, Education, Problem-solving and Support  Summary of Progress/Problems: Today's group addressed the issue of overcoming obstacles.  Patients were asked to identify their biggest obstacle post d/c that stands in the way of their on-going success, and then problem solve as to how to manage this. Stayed the entire time, engaged throughout.  No interaction for quite awhile, but once activated wanted to speak excessively.  Presented in a disorganized, vague, paranoid fashion. Mood was calm.  [name], [name] 10/31/2016   4:29 PM

## 2016-10-31 NOTE — Progress Notes (Signed)
Patient woken up at 1200, drowsy but alert.  Vitals pulse orthostatic (see flowsheet).  Given 12 oz gatorade and 12 oz of water, eating lunch, recheck vitals later.  Denies lightheadedness.

## 2016-11-01 NOTE — Plan of Care (Signed)
Problem: Safety: Goal: Ability to redirect hostility and anger into socially appropriate behaviors will improve Outcome: Progressing Pt was safe on the unit this evening, pt seen interacting in the dayroom with his peers

## 2016-11-01 NOTE — Progress Notes (Signed)
The focus of this group is to help patients review their daily goal of treatment and discuss progress on daily workbooks. Pt attended the evening group session and responded to all discussion prompts from the Writer. Pt shared that today was a good day on the unit, the highlight of which was "food, friends, and relaxing."  Pt told the group he felt happy for the other people on the unit that were able to discharge and that he hoped that would be him soon. "I'm not rushing like I was. I'm just trying to be positive and wait my turn."  Pt rated his day a 9 out of 10 and his affect was appropriate. Pt reported having no additional needs from Nursing Staff this evening.

## 2016-11-01 NOTE — Progress Notes (Signed)
Patient in bed with eyes closed, chest rises even and non-labored, respirations 16.  Due to recent manic bx and poor sleep previous night staff told to let patient sleep for now.

## 2016-11-01 NOTE — Tx Team (Signed)
Interdisciplinary Treatment and Diagnostic Plan Update  11/01/2016 Time of Session: 8:27 AM  [name] MRN: [id number]  Principal Diagnosis: Schizoaffective disorder, bipolar type ([place])  Secondary Diagnoses: Principal Problem:   Schizoaffective disorder, bipolar type ([place])   Current Medications:  Current Facility-Administered Medications  Medication Dose Route Frequency Provider Last Rate Last Dose  . acetaminophen (TYLENOL) tablet 650 mg  650 mg Oral Q6H PRN Okonkwo, Justina A, NP      . alum & mag hydroxide-simeth (MAALOX/MYLANTA) 200-200-20 MG/5ML suspension 30 mL  30 mL Oral Q4H PRN Okonkwo, Justina A, NP      . citalopram (CELEXA) tablet 20 mg  20 mg Oral Daily Okonkwo, Justina A, NP   20 mg at 10/31/16 1211  . divalproex (DEPAKOTE ER) 24 hr tablet 250 mg  250 mg Oral q12n4p Arfeen, Syed T, MD   250 mg at 10/31/16 1706  . hydrOXYzine (ATARAX/VISTARIL) tablet 25 mg  25 mg Oral TID PRN Hughie Closs A, NP   25 mg at 10/31/16 2142  . LORazepam (ATIVAN) tablet 2 mg  2 mg Oral Once PRN Rozetta Nunnery, NP       Or  . LORazepam (ATIVAN) injection 2 mg  2 mg Intramuscular Once PRN Lindon Romp A, NP      . magnesium hydroxide (MILK OF MAGNESIA) suspension 30 mL  30 mL Oral Daily PRN Okonkwo, Justina A, NP      . risperiDONE (RISPERDAL) tablet 4 mg  4 mg Oral QHS Okonkwo, Justina A, NP   4 mg at 10/31/16 2142  . risperiDONE microspheres (RISPERDAL CONSTA) injection 37.5 mg  37.5 mg Intramuscular Q14 Days Okonkwo, Justina A, NP   37.5 mg at 10/31/16 1213  . traZODone (DESYREL) tablet 100 mg  100 mg Oral QHS Lindell Spar I, NP   100 mg at 10/31/16 2142    PTA Medications: Prescriptions Prior to Admission  Medication Sig Dispense Refill Last Dose  . citalopram (CELEXA) 20 MG tablet Take 1 tablet (20 mg total) by mouth daily. For depression and anxiety. 30 tablet 0   . lithium carbonate (ESKALITH) 450 MG CR tablet Take 900 mg by mouth at bedtime.    03/15/2011  . mirtazapine (REMERON)  15 MG tablet Take 15 mg by mouth at bedtime.     03/15/2011  . paliperidone (INVEGA) 6 MG 24 hr tablet Take 6 mg by mouth every morning.     03/16/2011  . risperiDONE (RISPERDAL) 4 MG tablet Take 1 tablet (4 mg total) by mouth at bedtime. For psychosis and mood stabilization. 30 tablet 0   . risperiDONE microspheres (RISPERDAL CONSTA) 37.5 MG injection Inject 2 mLs (37.5 mg total) into the muscle every 14 (fourteen) days. For psychosis. 1 each    . traZODone (DESYREL) 100 MG tablet Take 100 mg by mouth at bedtime.     03/15/2011    Patient Stressors: Financial difficulties Medication change or noncompliance  Patient Strengths: Curator fund of knowledge Motivation for treatment/growth Physical Health Supportive family/friends  Treatment Modalities: Medication Management, Group therapy, Case management,  1 to 1 session with clinician, Psychoeducation, Recreational therapy.   Physician Treatment Plan for Primary Diagnosis: Schizoaffective disorder, bipolar type ([place]) Long Term Goal(s): Improvement in symptoms so as ready for discharge  Short Term Goals: Ability to identify changes in lifestyle to reduce recurrence of condition will improve Ability to verbalize feelings will improve Ability to disclose and discuss suicidal ideas Ability to demonstrate self-control will improve Ability to  demonstrate self-control will improve Ability to identify and develop effective coping behaviors will improve Ability to maintain clinical measurements within normal limits will improve Compliance with prescribed medications will improve Ability to identify triggers associated with substance abuse/mental health issues will improve  Medication Management: Evaluate patient's response, side effects, and tolerance of medication regimen.  Therapeutic Interventions: 1 to 1 sessions, Unit Group sessions and Medication administration.  Evaluation of Outcomes: Progressing  Physician  Treatment Plan for Secondary Diagnosis: Principal Problem:   Schizoaffective disorder, bipolar type ([place])   Long Term Goal(s): Improvement in symptoms so as ready for discharge  Short Term Goals: Ability to identify changes in lifestyle to reduce recurrence of condition will improve Ability to verbalize feelings will improve Ability to disclose and discuss suicidal ideas Ability to demonstrate self-control will improve Ability to demonstrate self-control will improve Ability to identify and develop effective coping behaviors will improve Ability to maintain clinical measurements within normal limits will improve Compliance with prescribed medications will improve Ability to identify triggers associated with substance abuse/mental health issues will improve  Medication Management: Evaluate patient's response, side effects, and tolerance of medication regimen.  Therapeutic Interventions: 1 to 1 sessions, Unit Group sessions and Medication administration.  Evaluation of Outcomes: Progressing   RN Treatment Plan for Primary Diagnosis: Schizoaffective disorder, bipolar type ([place]) Long Term Goal(s): Knowledge of disease and therapeutic regimen to maintain health will improve  Short Term Goals: Ability to identify and develop effective coping behaviors will improve and Compliance with prescribed medications will improve  Medication Management: RN will administer medications as ordered by provider, will assess and evaluate patient's response and provide education to patient for prescribed medication. RN will report any adverse and/or side effects to prescribing provider.  Therapeutic Interventions: 1 on 1 counseling sessions, Psychoeducation, Medication administration, Evaluate responses to treatment, Monitor vital signs and CBGs as ordered, Perform/monitor CIWA, COWS, AIMS and Fall Risk screenings as ordered, Perform wound care treatments as ordered.  Evaluation of Outcomes:  Progressing   LCSW Treatment Plan for Primary Diagnosis: Schizoaffective disorder, bipolar type ([place]) Long Term Goal(s): Safe transition to appropriate next level of care at discharge, Engage patient in therapeutic group addressing interpersonal concerns.  Short Term Goals: Engage patient in aftercare planning with referrals and resources  Therapeutic Interventions: Assess for all discharge needs, 1 to 1 time with Social worker, Explore available resources and support systems, Assess for adequacy in community support network, Educate family and significant other(s) on suicide prevention, Complete Psychosocial Assessment, Interpersonal group therapy.  Evaluation of Outcomes: Met  Return home, follow up [place] in Treatment: Attending groups: Yes Participating in groups: Yes Taking medication as prescribed: Yes Toleration medication: Yes, no side effects reported at this time Family/Significant other contact made: No Patient understands diagnosis: No Limited insight Discussing patient identified problems/goals with staff: Yes Medical problems stabilized or resolved: Yes Denies suicidal/homicidal ideation: Yes Issues/concerns per patient self-inventory: None Other: N/A  New problem(s) identified: None identified at this time.   New Short Term/Long Term Goal(s): None identified at this time.   Discharge Plan or Barriers:   Reason for Continuation of Hospitalization: Paranoia Disorganization Medication stabilization  Estimated Length of Stay: 3-5 days  Attendees: Patient: 11/01/2016  8:27 AM  Physician: Hampton Abbot, MD 11/01/2016  8:27 AM  Nursing: Sena Hitch, RN 11/01/2016  8:27 AM  RN Care Manager: Lars Pinks, RN 11/01/2016  8:27 AM  Social Worker: Ripley Fraise 11/01/2016  8:27 AM  Recreational Therapist: Winfield Cunas  11/01/2016  8:27 AM  Other: Norberto Sorenson 11/01/2016  8:27 AM  Other:  11/01/2016  8:27 AM    Scribe for Treatment  Team:  Roque Lias LCSW 11/01/2016 8:27 AM \

## 2016-11-01 NOTE — [facility] Group Notes (Signed)
[facility] LCSW Group Therapy  11/01/2016 , 2:04 PM   Type of Therapy:  Group Therapy  Participation Level:  Active  Participation Quality:  Attentive  Affect:  Appropriate  Cognitive:  Alert  Insight:  Improving  Engagement in Therapy:  Engaged  Modes of Intervention:  Discussion, Exploration and Socialization  Summary of Progress/Problems: Today's group focused on the term Diagnosis.  Participants were asked to define the term, and then pronounce whether it is a negative, positive or neutral term. Invited.  In bed asleep.  Daryel Geraldorth, Sumi Lye B 11/01/2016 , 2:04 PM

## 2016-11-01 NOTE — Progress Notes (Signed)
D: Pt denies SI/HI/AH. +ve VH- colors/ shadows. Pt is pleasant and cooperative. Pt stated that music helps him cope with things going on. Pt keeps to himself, but will talk if engaged, pt did have flight of ideas at times and appears disorganized while talking, but was appropriate on the unit this evening  A: Pt was offered support and encouragement. Pt was given scheduled medications. Pt was encourage to attend groups. Q 15 minute checks were done for safety.   R:Pt attends groups and interacts well with peers and staff. Pt is taking medication. Pt has no complaints.Pt receptive to treatment and safety maintained on unit.

## 2016-11-01 NOTE — Progress Notes (Signed)
Nursing Note 11/01/2016 [id number]  Data Reports sleeping good, slept until 1000 this AM.  Affect blunted but appropriate.  Denies HI, SI, AVH.  Patient somewhat tangential and bizarre.  In dayroom laughing and interacting with peers. Drinking plenty of fluids today.    Action Spoke with patient 1:1, nurse offered support to patient throughout shift.  Continues to be monitored on 15 minute checks for safety.  Response Remains safe on unit.

## 2016-11-01 NOTE — Progress Notes (Signed)
Patient ID: Reynold BowenRaheem D Mitchell, male   DOB: 11-Nov-1990, 26 y.o.   MRN: [id number]  PER STATE REGULATIONS 482.30  THIS CHART WAS REVIEWED FOR MEDICAL NECESSITY WITH RESPECT TO THE PATIENT'S ADMISSION/ DURATION OF STAY.  NEXT REVIEW DATE: 11/05/2016  Willa RoughJENNIFER JONES Jenalyn Girdner, RN, BSN CASE MANAGER

## 2016-11-01 NOTE — Progress Notes (Signed)
Recreation Therapy Notes  Date: 11/01/2016 Time: 10:00am Location: [address] Dayroom  Group Topic: Teamwork, Communication, Problem-Solving  Goal Area(s) Addresses:  Pt will be able to successfully identify the benefits of communication when working in teams. Pt will be able to successfully link this activity with how communication impacts their lives outside of the hospital.  Behavioral Response: Engaged  Intervention:  [employer] "Choices" Game  Activity: Pts will work together to reach a common goal by using communication. Pts will be split into smaller groups,A and B and will be asked to work together to trade places so team A switches spots with team B using specific rules. Once pts finished the first activity they played a game called "Choices" that is similar to "Would You Rather?" Pts will make a choice and explain why they made that decision.  Education:Teamwork, Communication, Problem-Solving, Discharge Planning  Education Outcome: Needs additional education  Clinical Observations/Feedback: Approximately 10:20am pt joined group and participated in the second activity.  Marvell Fullerachel Meyer, Recreation Therapy Intern  Caroll RancherMarjette Andrena Margerum, LRT/CTRS

## 2016-11-01 NOTE — Progress Notes (Signed)
[facility]  11/01/2016 2:57 PM [name]  MRN:  [id number] Subjective:  Patient case is reviewed and discussed with staff. Patient states he is feeling good today. He denies any SI/HI/AVH and "that's why I like this medicine, it gets rid of all of those voices." He reports good sleep and appetite, but has noticed he doesn't eat as much as he was on his other medication and he is happy about that. He denies any side effects to the medications. He reports taht he will be [facility] and he said they use to come to his house. He says he is on [facility] ACTT.  Objective: Patient is pleasant and cooperative. He does not appear to be responding to any internal stimuli and does not appear depressed or anxious.   Principal Problem: Schizoaffective disorder, bipolar type (HCC) Diagnosis:   Patient Active Problem List   Diagnosis Date Noted  . Schizoaffective disorder, bipolar type (HCC) [F25.0] 10/25/2011  . TEAR A C L [S83.509A] 01/21/2008  . SUBLUXATION PATELLAR (MALALIGNMENT) [M24.469] 12/17/2007  . PATELLAR DISLOCATION, LEFT [S83.006A] 12/17/2007   Total Time spent with patient: 25 minutes  Past Psychiatric History: See H&P  Past Medical History:  Past Medical History:  Diagnosis Date  . Bipolar 1 disorder [facility](HCC)     Past Surgical History:  Procedure Laterality Date  . KNEE ARTHROSCOPY    . KNEE SURGERY     Family History: History reviewed. No pertinent family history. Family Psychiatric  History: See H&P Social History:  History  Alcohol Use No     History  Drug Use No    Social History   Social History  . Marital status: Single    Spouse name: N/A  . Number of children: N/A  . Years of education: N/A   Social History Main Topics  . Smoking status: Current Every Day Smoker    Types: Cigarettes  . Smokeless tobacco: Never Used  . Alcohol use No  . Drug use: No  . Sexual activity: Not Asked   Other Topics Concern  . None    Social History Narrative   ** Merged History Encounter **       ** Merged History Encounter **       Additional Social History:    Sleep: Good  Appetite:  Good  Current Medications: Current Facility-Administered Medications  Medication Dose Route Frequency Provider Last Rate Last Dose  . acetaminophen (TYLENOL) tablet 650 mg  650 mg Oral Q6H PRN Okonkwo, Justina A, NP      . alum & mag hydroxide-simeth (MAALOX/MYLANTA) 200-200-20 MG/5ML suspension 30 mL  30 mL Oral Q4H PRN Okonkwo, Justina A, NP      . citalopram (CELEXA) tablet 20 mg  20 mg Oral Daily Okonkwo, Justina A, NP   20 mg at 11/01/16 1205  . divalproex (DEPAKOTE ER) 24 hr tablet 250 mg  250 mg Oral q12n4p Arfeen, Phillips GroutSyed T, MD   250 mg at 11/01/16 1205  . hydrOXYzine (ATARAX/VISTARIL) tablet 25 mg  25 mg Oral TID PRN Beryle Lathekonkwo, Justina A, NP   25 mg at 10/31/16 2142  . LORazepam (ATIVAN) tablet 2 mg  2 mg Oral Once PRN Jackelyn PolingBerry, Jason A, NP       Or  . LORazepam (ATIVAN) injection 2 mg  2 mg Intramuscular Once PRN Nira ConnBerry, Jason A, NP      . magnesium hydroxide (MILK OF MAGNESIA) suspension 30 mL  30 mL Oral Daily PRN  Ferne Reuskonkwo, Justina A, NP      . risperiDONE (RISPERDAL) tablet 4 mg  4 mg Oral QHS Okonkwo, Justina A, NP   4 mg at 10/31/16 2142  . risperiDONE microspheres (RISPERDAL CONSTA) injection 37.5 mg  37.5 mg Intramuscular Q14 Days Okonkwo, Justina A, NP   37.5 mg at 10/31/16 1213  . traZODone (DESYREL) tablet 100 mg  100 mg Oral QHS Armandina StammerNwoko, Agnes I, NP   100 mg at 10/31/16 2142    Lab Results: No results found for this or any previous visit (from the past 48 hour(s)).  Blood Alcohol level:  Lab Results  Component Value Date   [facility] 10/27/2016   ETH <11 10/24/2011    Metabolic Disorder Labs: Lab Results  Component Value Date   HGBA1C  07/08/2009    5.7 (NOTE) The ADA recommends the following therapeutic goal for glycemic control related to Hgb A1c measurement: Goal of therapy: <6.5 Hgb A1c  Reference: American  Diabetes Association: Clinical Practice Recommendations 2010, Diabetes Care, 2010, 33: (Suppl  1).   MPG 117 07/08/2009   No results found for: PROLACTIN Lab Results  Component Value Date   CHOL  07/08/2009    125        ATP III CLASSIFICATION:  <200     mg/dL   Desirable  161-096200-239  mg/dL   Borderline High  >=045>=240    mg/dL   High          TRIG 61 07/08/2009   HDL 46 07/08/2009   CHOLHDL 2.7 07/08/2009   VLDL 12 07/08/2009   LDLCALC  07/08/2009    67        Total Cholesterol/HDL:CHD Risk Coronary Heart Disease Risk Table                     Men   Women  1/2 Average Risk   3.4   3.3  Average Risk       5.0   4.4  2 X Average Risk   9.6   7.1  3 X Average Risk  23.4   11.0        Use the calculated Patient Ratio above and the CHD Risk Table to determine the patient's CHD Risk.        ATP III CLASSIFICATION (LDL):  <100     mg/dL   Optimal  409-811100-129  mg/dL   Near or Above                    Optimal  130-159  mg/dL   Borderline  914-782160-189  mg/dL   High  >956>190     mg/dL   Very High    Physical Findings: AIMS: Facial and Oral Movements Muscles of Facial Expression: None, normal Lips and Perioral Area: None, normal Jaw: None, normal Tongue: None, normal,Extremity Movements Upper (arms, wrists, hands, fingers): None, normal Lower (legs, knees, ankles, toes): None, normal, Trunk Movements Neck, shoulders, hips: None, normal, Overall Severity Severity of abnormal movements (highest score from questions above): None, normal Incapacitation due to abnormal movements: None, normal Patient's awareness of abnormal movements (rate only patient's report): No Awareness, Dental Status Current problems with teeth and/or dentures?: No Does patient usually wear dentures?: No  CIWA:    COWS:     Musculoskeletal: Strength & Muscle Tone: within normal limits Gait & Station: normal Patient leans: N/A  Psychiatric Specialty Exam: Physical Exam  Nursing note and vitals  reviewed. Constitutional: He is oriented  to person, place, and time. He appears well-developed and well-nourished.  Respiratory: Effort normal.  Musculoskeletal: Normal range of motion.  Neurological: He is alert and oriented to person, place, and time.    Review of Systems  Constitutional: Negative.   HENT: Negative.   Eyes: Negative.   Respiratory: Negative.   Cardiovascular: Negative.   Gastrointestinal: Negative.   Genitourinary: Negative.   Musculoskeletal: Negative.   Skin: Negative.   Neurological: Negative.   Endo/Heme/Allergies: Negative.     Blood pressure (!) 148/82, pulse (!) 102, temperature 97.7 F (36.5 C), temperature source Oral, resp. rate 16, height 5\' 7"  (1.702 m), weight 100.7 kg (222 lb).Body mass index is 34.77 kg/m.  General Appearance: Casual  Eye Contact:  Good  Speech:  Clear and Coherent  Volume:  Normal  Mood:  Euthymic  Affect:  Flat  Thought Process:  Coherent and Descriptions of Associations: Intact  Orientation:  Full (Time, Place, and Person)  Thought Content:  WDL  Suicidal Thoughts:  No  Homicidal Thoughts:  No  Memory:  Immediate;   Good  Judgement:  Fair  Insight:  Fair  Psychomotor Activity:  Normal  Concentration:  Concentration: Good and Attention Span: Good  Recall:  Good  Fund of Knowledge:  Good  Language:  Good  Akathisia:  No  Handed:  Right  AIMS (if indicated):     Assets:  Financial Resources/Insurance Social Support  ADL's:  Intact  Cognition:  WNL  Sleep:  Number of Hours: 1.25     Treatment Plan Summary: Daily contact with patient to assess and evaluate symptoms and progress in treatment, Medication management and Plan is to:  -Continue Celexa 20- mg PO Daily for mood stability -Continue Depakote 250 mg PO BID for mood stability -Continue Risperidone 4 mg PO QHS for mood stability -Continue Trazodone 100 mg PO QHS PRN for insomnia -Continue Risperidone Consta 37.5 mg IM Q14 days for mood stability -Continue  Hydroxyzine PRN for anxiety -Continue Ativan PRN for severe agitation -Encourage group therapy participation -SW staff to confirm if patient is on ACTT with [facility].  Grant Burdock Anniece Bleiler, FNP 11/01/2016, 2:57 PM/

## 2016-11-01 NOTE — Plan of Care (Signed)
Problem: Safety: Goal: Periods of time without injury will increase Outcome: Progressing Pt remains a low fall risk, denies SI/HI, Q 15 checks in effect.

## 2016-11-02 MED ORDER — LISINOPRIL 5 MG PO TABS
5.0000 mg | ORAL_TABLET | Freq: Every day | ORAL | Status: DC
  Administered 2016-11-02 – 2016-11-04 (×3): 5 mg via ORAL
  Filled 2016-11-02 (×5): qty 1

## 2016-11-02 NOTE — Progress Notes (Signed)
Adult Psychoeducational Group Note  Date:  11/02/2016 Time:  9:57 PM  Group Topic/Focus:  Wrap-Up Group:   The focus of this group is to help patients review their daily goal of treatment and discuss progress on daily workbooks.  Participation Level:  Active  Participation Quality:  Appropriate  Affect:  Appropriate  Cognitive:  Alert  Insight: Good  Engagement in Group:  Supportive  Modes of Intervention:  Discussion  Additional Comments:  Patient stated he slept through the first group of the day, but attended the second. He rated his day as a 9. Patient was outgoing and helpful towards other group participants.  Patty SermonsZachary J Lissete Maestas 11/02/2016, 9:57 PM

## 2016-11-02 NOTE — Progress Notes (Signed)
[facility]  11/02/2016 3:43 PM [name]  MRN:  [id number]  Subjective: [name] reports, "I'm doing better. My mood is starting to improve".  Objective: [name] is seen, chart reviewed. This case is reviewed and discussed with the staff. Patient states today that his mood is improving. He denies any SI/HI/AVH & says his medications are helping. He denies any adverse effects. He reports good sleep and appetite, He reports that [facility]. He remains visible on the unit, attending & participating in the group sessions. [facility]. Staff continues to provide support.  Principal Problem: Schizoaffective disorder, bipolar type (HCC) Diagnosis:   Patient Active Problem List   Diagnosis Date Noted  . Schizoaffective disorder, bipolar type (HCC) [F25.0] 10/25/2011  . TEAR A C L [S83.509A] 01/21/2008  . SUBLUXATION PATELLAR (MALALIGNMENT) [M24.469] 12/17/2007  . PATELLAR DISLOCATION, LEFT [S83.006A] 12/17/2007   Total Time spent with patient: 15 minutes  Past Psychiatric History: See H&P  Past Medical History:  Past Medical History:  Diagnosis Date  . Bipolar 1 disorder [facility](HCC)     Past Surgical History:  Procedure Laterality Date  . KNEE ARTHROSCOPY    . KNEE SURGERY     Family History: History reviewed. No pertinent family history. Family Psychiatric  History: See H&P Social History:  History  Alcohol Use No     History  Drug Use No    Social History   Social History  . Marital status: Single    Spouse name: N/A  . Number of children: N/A  . Years of education: N/A   Social History Main Topics  . Smoking status: Current Every Day Smoker    Types: Cigarettes  . Smokeless tobacco: Never Used  . Alcohol use No  . Drug use: No  . Sexual activity: Not Asked   Other Topics Concern  . None   Social History Narrative   ** Merged History Encounter **       ** Merged History Encounter **        Additional Social History:    Sleep: Good  Appetite:  Good  Current Medications: Current Facility-Administered Medications  Medication Dose Route Frequency Provider Last Rate Last Dose  . acetaminophen (TYLENOL) tablet 650 mg  650 mg Oral Q6H PRN Okonkwo, Justina A, NP      . alum & mag hydroxide-simeth (MAALOX/MYLANTA) 200-200-20 MG/5ML suspension 30 mL  30 mL Oral Q4H PRN Okonkwo, Justina A, NP      . citalopram (CELEXA) tablet 20 mg  20 mg Oral Daily Okonkwo, Justina A, NP   20 mg at 11/02/16 0754  . divalproex (DEPAKOTE ER) 24 hr tablet 250 mg  250 mg Oral q12n4p Arfeen, Syed T, MD   250 mg at 11/01/16 1657  . hydrOXYzine (ATARAX/VISTARIL) tablet 25 mg  25 mg Oral TID PRN Ferne Reuskonkwo, Justina A, NP   25 mg at 10/31/16 2142  . lisinopril (PRINIVIL,ZESTRIL) tablet 5 mg  5 mg Oral Daily Nwoko, Agnes I, NP      . LORazepam (ATIVAN) tablet 2 mg  2 mg Oral Once PRN Jackelyn PolingBerry, Jason A, NP       Or  . LORazepam (ATIVAN) injection 2 mg  2 mg Intramuscular Once PRN Nira ConnBerry, Jason A, NP      . magnesium hydroxide (MILK OF MAGNESIA) suspension 30 mL  30 mL Oral Daily PRN Okonkwo, Justina A, NP      . risperiDONE (  RISPERDAL) tablet 4 mg  4 mg Oral QHS Okonkwo, Justina A, NP   4 mg at 11/01/16 2120  . risperiDONE microspheres (RISPERDAL CONSTA) injection 37.5 mg  37.5 mg Intramuscular Q14 Days Okonkwo, Justina A, NP   37.5 mg at 10/31/16 1213  . traZODone (DESYREL) tablet 100 mg  100 mg Oral QHS Armandina Stammer I, NP   100 mg at 11/01/16 2120    Lab Results: No results found for this or any previous visit (from the past 48 hour(s)).  Blood Alcohol level:  Lab Results  Component Value Date   [facility] <5 10/27/2016   ETH <11 10/24/2011    Metabolic Disorder Labs: Lab Results  Component Value Date   HGBA1C  07/08/2009    5.7 (NOTE) The ADA recommends the following therapeutic goal for glycemic control related to Hgb A1c measurement: Goal of therapy: <6.5 Hgb A1c  Reference: American Diabetes Association:  Clinical Practice Recommendations 2010, Diabetes Care, 2010, 33: (Suppl  1).   MPG 117 07/08/2009   No results found for: PROLACTIN Lab Results  Component Value Date   CHOL  07/08/2009    125        ATP III CLASSIFICATION:  <200     mg/dL   Desirable  914-782  mg/dL   Borderline High  >=956    mg/dL   High          TRIG 61 07/08/2009   HDL 46 07/08/2009   CHOLHDL 2.7 07/08/2009   VLDL 12 07/08/2009   LDLCALC  07/08/2009    67        Total Cholesterol/HDL:CHD Risk Coronary Heart Disease Risk Table                     Men   Women  1/2 Average Risk   3.4   3.3  Average Risk       5.0   4.4  2 X Average Risk   9.6   7.1  3 X Average Risk  23.4   11.0        Use the calculated Patient Ratio above and the CHD Risk Table to determine the patient's CHD Risk.        ATP III CLASSIFICATION (LDL):  <100     mg/dL   Optimal  213-086  mg/dL   Near or Above                    Optimal  130-159  mg/dL   Borderline  578-469  mg/dL   High  >629     mg/dL   Very High    Physical Findings: AIMS: Facial and Oral Movements Muscles of Facial Expression: None, normal Lips and Perioral Area: None, normal Jaw: None, normal Tongue: None, normal,Extremity Movements Upper (arms, wrists, hands, fingers): None, normal Lower (legs, knees, ankles, toes): None, normal, Trunk Movements Neck, shoulders, hips: None, normal, Overall Severity Severity of abnormal movements (highest score from questions above): None, normal Incapacitation due to abnormal movements: None, normal Patient's awareness of abnormal movements (rate only patient's report): No Awareness, Dental Status Current problems with teeth and/or dentures?: No Does patient usually wear dentures?: No  CIWA:    COWS:     Musculoskeletal: Strength & Muscle Tone: within normal limits Gait & Station: normal Patient leans: N/A  Psychiatric Specialty Exam: Physical Exam  Nursing note and vitals reviewed. Constitutional: He is  oriented to person, place, and time. He appears well-developed and well-nourished.  Respiratory: Effort normal.  Musculoskeletal: Normal range of motion.  Neurological: He is alert and oriented to person, place, and time.    Review of Systems  Constitutional: Negative.   HENT: Negative.   Eyes: Negative.   Respiratory: Negative.   Cardiovascular: Negative.   Gastrointestinal: Negative.   Genitourinary: Negative.   Musculoskeletal: Negative.   Skin: Negative.   Neurological: Negative.   Endo/Heme/Allergies: Negative.     Blood pressure 133/85, pulse 89, temperature 97.7 F (36.5 C), resp. rate 18, height 5\' 7"  (1.702 m), weight 100.7 kg (222 lb).Body mass index is 34.77 kg/m.  General Appearance: Casual  Eye Contact:  Good  Speech:  Clear and Coherent  Volume:  Normal  Mood:  Euthymic, Hopeless and "Improving"  Affect:  Reactive  Thought Process:  Coherent and Descriptions of Associations: Intact  Orientation:  Full (Time, Place, and Person)  Thought Content:  Rumination  Suicidal Thoughts:  Denies any thoughts, plans or intent  Homicidal Thoughts:  Denies any thoughts, plans or intent.  Memory:  Immediate;   Good Recent;   Fair Remote;   Fair  Judgement:  Fair  Insight:  Fair  Psychomotor Activity:  Normal  Concentration:  Concentration: Good and Attention Span: Good  Recall:  Good  Fund of Knowledge:  Good  Language:  Good  Akathisia:  No  Handed:  Right  AIMS (if indicated):     Assets:  Financial Resources/Insurance Social Support  ADL's:  Intact  Cognition:  WNL  Sleep:  Number of Hours: 1.25   Treatment Plan Summary: Daily contact with patient to assess and evaluate symptoms and progress in treatment, Medication management and Plan is to:   Will continue today 11/02/16 plan as below except where it is noted.  Depression: -Continue Celexa 20- mg PO Daily for mood stability.  Mood stabilization: -Continue Depakote 250 mg PO BID for mood  stability.  Mood control -Continue Risperidone 4 mg PO QHS for mood stability. --Continue Risperidone Consta 37.5 mg IM Q14 days for mood stability  Insomnia: -Continue Trazodone 100 mg PO QHS PRN.  Anxiety: -Continue Hydroxyzine PRN for anxiety. -Continue Ativan PRN for severe agitation  -Encourage group therapy participation -SW staff to confirm if patient is on ACTT with [facility].  Sanjuana KavaNwoko, Agnes I, NP, PMHNP, FNP-BC 11/02/2016, 3:43 PM/Patient ID: Grant Bowenaheem D Howard, male   DOB: 10/30/1990, 26 y.o.   MRN: [id number] Agree with NP Progress Note

## 2016-11-02 NOTE — Progress Notes (Signed)
Recreation Therapy Notes   Date: 11/02/2016 Time: 10:00am Location: [address] Dayroom  Group Topic: Self-Esteem  Goal Area(s) Addresses:  Pt will be able to identify positive characteristics.  Behavioral Response: Engaged  Intervention: Game  Activity: Pts will throw back and forth a beach ball that has self-esteem questions, statements and open-ended sentences. When a pt gets the ball they have to answer the question, say the sentence or finish the sentence aloud.  Education:Self-Esteem, Discharge Planning  Education Outcome: Acknowledges understanding   Clinical Observations/Feedback: Pt actively participated in the activity. Pt identified that he has not achieved his goal yet, but he would like to receive a BET award.  [name], Recreation Therapy Intern  Caroll RancherMarjette Yobani Howard, LRT/CTRS

## 2016-11-02 NOTE — [facility] Group Notes (Signed)
[facility] Group Notes:  (Counselor/Nursing/MHT/Case Management/Adjunct)  11/02/2016 1:15PM  Type of Therapy:  Group Therapy  Participation Level:  Active  Participation Quality:  Appropriate  Affect:  Flat  Cognitive:  Oriented  Insight:  Improving  Engagement in Group:  Limited  Engagement in Therapy:  Limited  Modes of Intervention:  Discussion, Exploration and Socialization  Summary of Progress/Problems: The topic for group was balance in life.  Pt participated in the discussion about when their life was in balance and out of balance and how this feels.  Pt discussed ways to get back in balance and short term goals they can work on to get where they want to be. Invited.  In bed, asleep. Did not attend.   Ida Rogueorth, Alyas Creary B 11/02/2016 3:18 PM

## 2016-11-02 NOTE — Progress Notes (Signed)
  DATA ACTION RESPONSE  Objective- Pt. is visible in the dayroom, seen watching TV and eating a snack. Presents with a depressed   affect and mood. Tangentials in thought process. No further c/o. No abnormal s/s.  Subjective- Denies having any SI/HI/AVH/Pain at this time.Pt. states "I'm not rushing the discharge; I just want to get well". Is cooperative and remain safe on the unit.  1:1 interaction in private to establish rapport. Encouragement, education, & support given from staff.     Safety maintained with Q 15 checks. Continue with POC.

## 2016-11-02 NOTE — Progress Notes (Signed)
Patient has been isolative to the room for the majority of the shift.  Patient denies SI, Hi and AVH.  Patient forwarded little to writer today and only answered questions with a yes or no response.    Assess patient for safety, offer medications as prescribed, engage patient in 1:1 staff talks.   Continue to monitor, patient able to contract for safety.

## 2016-11-03 MED ORDER — RISPERIDONE MICROSPHERES 37.5 MG IM SUSR: 37.5000 mg | INTRAMUSCULAR | 0 refills | Status: AC

## 2016-11-03 MED ORDER — DIVALPROEX SODIUM ER 250 MG PO TB24: 250.0000 mg | ORAL_TABLET | Freq: Two times a day (BID) | ORAL | 0 refills | Status: AC

## 2016-11-03 MED ORDER — CITALOPRAM HYDROBROMIDE 20 MG PO TABS: 20.0000 mg | ORAL_TABLET | Freq: Every day | ORAL | 0 refills | Status: AC

## 2016-11-03 MED ORDER — LISINOPRIL 5 MG PO TABS: 5.0000 mg | ORAL_TABLET | Freq: Every day | ORAL | 0 refills | Status: AC

## 2016-11-03 MED ORDER — RISPERIDONE 4 MG PO TABS: 4.0000 mg | ORAL_TABLET | Freq: Every day | ORAL | 0 refills | Status: AC

## 2016-11-03 MED ORDER — TRAZODONE HCL 100 MG PO TABS
100.0000 mg | ORAL_TABLET | Freq: Every day | ORAL | 0 refills | Status: AC
Start: 2016-11-03 — End: ?

## 2016-11-03 MED ORDER — HYDROXYZINE HCL 25 MG PO TABS: 25.0000 mg | ORAL_TABLET | Freq: Three times a day (TID) | ORAL | 0 refills | Status: AC | PRN

## 2016-11-03 NOTE — Progress Notes (Signed)
Patient has been out in the milieu engaged in groups and unit activities.  Patient denies SI, HI, and AVH.  Patient reports feeling less anxious and depressed.  Patient has been compliant with medications.   Assess patient for safety, offer medications as prescribed, engage patient in 1:1 staff talks.   Continue to monitor as prescribed, patient able to contract for safety.

## 2016-11-03 NOTE — [facility] Group Notes (Signed)
[facility]  11/03/2016 , 1:52 PM    Type of Therapy:  [employer] Presentation  Participation Level:  Active  Participation Quality:  Attentive  Affect:  Blunted  Cognitive:  Oriented  Insight:  Limited  Engagement in Therapy:  Engaged  Modes of Intervention:  Discussion, Education and Socialization  Summary of Progress/Problems:  Onalee HuaDavid from [employer] came to present his recovery story and play the guitar.  Stayed the entire time, engaged throughout. Shared at the end in a disorganized manner about his recovery.  Daryel Geraldorth, Herberta Pickron B 11/03/2016 , 1:52 PM

## 2016-11-03 NOTE — Progress Notes (Signed)
Recreation Therapy Notes  Date: 11/03/2016 Time: 10:00am Location: [address] Dayroom  Group Topic: Leisure Education  Goal Area(s) Addresses:  Pt will be able to identify leisure activities. Pt will be able to successfully identify the benefits of participating in leisure activities.  Behavioral Response: Engaged  Intervention: Art  Activity: Pts will work in teams and make a leisure Primary school teacherpublic service announcement using magazine pictures, tape and markers to identify the things they enjoy doing for leisure. Pts will identify what the benefits are from participating in leisure activities.  Education: Leisure Education, Building control surveyorDischarge Planning  Education Outcome: Acknowledges understanding  Clinical Observations/Feedback: Approximately 1-:30am pt joined group. Pt identified that he feels accomplished when he participates in leisure activities.  Marvell Fullerachel Meyer, Recreation Therapy Intern  Caroll RancherMarjette Terel Bann, LRT/CTRS

## 2016-11-03 NOTE — Plan of Care (Signed)
Problem: Coping: Goal: Ability to cope will improve Outcome: Progressing Pt stated he was doing better, pt was seen interacting with peers in the dayroom this evening

## 2016-11-03 NOTE — Progress Notes (Signed)
[facility] MD Progress Note  11/03/2016 10:58 AM [name]  MRN:  [id number]  Subjective: [name] reports, "I'm doing okay. My mood is improving".  Objective: [name] is seen, chart reviewed. This case is reviewed and discussed with the staff. Patient states today that his mood is improving. He denies any SI/HI/AVH & says his medications are helping. He denies any adverse effects. He reports good sleep and appetite, He reports that he will be following up with the [facility] clinic after discharge. Staff reports that patient is cooperative with staff. No disruptive behavior on the unit, however, he presents as if he responding to some internal stimuli. He remains visible on the unit, attending & participating in the group sessions. He self-isolate sometimes. He has the [employer]. Staff continues to provide support.  Principal Problem: Schizoaffective disorder, bipolar type (HCC)  Diagnosis:   Patient Active Problem List   Diagnosis Date Noted  . Schizoaffective disorder, bipolar type (HCC) [F25.0] 10/25/2011  . TEAR A C L [S83.509A] 01/21/2008  . SUBLUXATION PATELLAR (MALALIGNMENT) [M24.469] 12/17/2007  . PATELLAR DISLOCATION, LEFT [S83.006A] 12/17/2007   Total Time spent with patient: 15 minutes  Past Psychiatric History: See H&P  Past Medical History:  Past Medical History:  Diagnosis Date  . Bipolar 1 disorder [facility])     Past Surgical History:  Procedure Laterality Date  . KNEE ARTHROSCOPY    . KNEE SURGERY     Family History: History reviewed. No pertinent family history.  Family Psychiatric  History: See H&P  Social History:  History  Alcohol Use No     History  Drug Use No    Social History   Social History  . Marital status: Single    Spouse name: N/A  . Number of children: N/A  . Years of education: N/A   Social History Main Topics  . Smoking status: Current Every Day Smoker    Types: Cigarettes  . Smokeless tobacco: Never Used  . Alcohol use No  . Drug use: No   . Sexual activity: Not Asked   Other Topics Concern  . None   Social History Narrative   ** Merged History Encounter **       ** Merged History Encounter **       Additional Social History:    Sleep: Good  Appetite:  Good  Current Medications: Current Facility-Administered Medications  Medication Dose Route Frequency Provider Last Rate Last Dose  . acetaminophen (TYLENOL) tablet 650 mg  650 mg Oral Q6H PRN Okonkwo, Justina A, NP      . alum & mag hydroxide-simeth (MAALOX/MYLANTA) 200-200-20 MG/5ML suspension 30 mL  30 mL Oral Q4H PRN Okonkwo, Justina A, NP      . citalopram (CELEXA) tablet 20 mg  20 mg Oral Daily Okonkwo, Justina A, NP   20 mg at 11/03/16 0825  . divalproex (DEPAKOTE ER) 24 hr tablet 250 mg  250 mg Oral q12n4p Arfeen, Syed T, MD   250 mg at 11/01/16 1657  . hydrOXYzine (ATARAX/VISTARIL) tablet 25 mg  25 mg Oral TID PRN Ferne Reus A, NP   25 mg at 11/02/16 2207  . lisinopril (PRINIVIL,ZESTRIL) tablet 5 mg  5 mg Oral Daily Armandina Stammer I, NP   5 mg at 11/03/16 0825  . LORazepam (ATIVAN) tablet 2 mg  2 mg Oral Once PRN Jackelyn Poling, NP       Or  . LORazepam (ATIVAN) injection 2 mg  2 mg Intramuscular Once PRN Jackelyn Poling,  NP      . magnesium hydroxide (MILK OF MAGNESIA) suspension 30 mL  30 mL Oral Daily PRN Okonkwo, Justina A, NP      . risperiDONE (RISPERDAL) tablet 4 mg  4 mg Oral QHS Okonkwo, Justina A, NP   4 mg at 11/02/16 2207  . risperiDONE microspheres (RISPERDAL CONSTA) injection 37.5 mg  37.5 mg Intramuscular Q14 Days Okonkwo, Justina A, NP   37.5 mg at 10/31/16 1213  . traZODone (DESYREL) tablet 100 mg  100 mg Oral QHS Armandina StammerNwoko, Agnes I, NP   100 mg at 11/02/16 2207   Lab Results: No results found for this or any previous visit (from the past 48 hour(s)).  Blood Alcohol level:  Lab Results  Component Value Date   [facility] 10/[facility]2016   ETH <11 10/24/2011   Metabolic Disorder Labs: Lab Results  Component Value Date   HGBA1C  07/08/2009     5.7 (NOTE) The ADA recommends the following therapeutic goal for glycemic control related to Hgb A1c measurement: Goal of therapy: <6.5 Hgb A1c  Reference: American Diabetes Association: Clinical Practice Recommendations 2010, Diabetes Care, 2010, 33: (Suppl  1).   MPG 117 07/08/2009   No results found for: PROLACTIN Lab Results  Component Value Date   CHOL  07/08/2009    125        ATP III CLASSIFICATION:  <200     mg/dL   Desirable  696-295200-239  mg/dL   Borderline High  >=284>=240    mg/dL   High          TRIG 61 07/08/2009   HDL 46 07/08/2009   CHOLHDL 2.7 07/08/2009   VLDL 12 07/08/2009   LDLCALC  07/08/2009    67        Total Cholesterol/HDL:CHD Risk Coronary Heart Disease Risk Table                     Men   Women  1/2 Average Risk   3.4   3.3  Average Risk       5.0   4.4  2 X Average Risk   9.6   7.1  3 X Average Risk  23.4   11.0        Use the calculated Patient Ratio above and the CHD Risk Table to determine the patient's CHD Risk.        ATP III CLASSIFICATION (LDL):  <100     mg/dL   Optimal  132-440100-129  mg/dL   Near or Above                    Optimal  130-159  mg/dL   Borderline  102-725160-189  mg/dL   High  >366>190     mg/dL   Very High   Physical Findings: AIMS: Facial and Oral Movements Muscles of Facial Expression: None, normal Lips and Perioral Area: None, normal Jaw: None, normal Tongue: None, normal,Extremity Movements Upper (arms, wrists, hands, fingers): None, normal Lower (legs, knees, ankles, toes): None, normal, Trunk Movements Neck, shoulders, hips: None, normal, Overall Severity Severity of abnormal movements (highest score from questions above): None, normal Incapacitation due to abnormal movements: None, normal Patient's awareness of abnormal movements (rate only patient's report): No Awareness, Dental Status Current problems with teeth and/or dentures?: No Does patient usually wear dentures?: No  CIWA:    COWS:     Musculoskeletal: Strength &  Muscle Tone: within normal limits Gait & Station: normal Patient  leans: N/A  Psychiatric Specialty Exam: Physical Exam  Nursing note and vitals reviewed. Constitutional: He is oriented to person, place, and time. He appears well-developed and well-nourished.  Respiratory: Effort normal.  Musculoskeletal: Normal range of motion.  Neurological: He is alert and oriented to person, place, and time.    Review of Systems  Constitutional: Negative.   HENT: Negative.   Eyes: Negative.   Respiratory: Negative.   Cardiovascular: Negative.   Gastrointestinal: Negative.   Genitourinary: Negative.   Musculoskeletal: Negative.   Skin: Negative.   Neurological: Negative.   Endo/Heme/Allergies: Negative.     Blood pressure 131/80, pulse 85, temperature (!) 97.4 F (36.3 C), resp. rate 20, height 5\' 7"  (1.702 m), weight 100.7 kg (222 lb).Body mass index is 34.77 kg/m.  General Appearance: Casual  Eye Contact:  Good  Speech:  Clear and Coherent  Volume:  Normal  Mood:  "Improving"  Affect:  Reactive  Thought Process:  Coherent and Descriptions of Associations: Intact  Orientation:  Full (Time, Place, and Person)  Thought Content:  Rumination  Suicidal Thoughts:  Denies any thoughts, plans or intent  Homicidal Thoughts:  Denies any thoughts, plans or intent.  Memory:  Immediate;   Good Recent;   Fair Remote;   Fair  Judgement:  Fair  Insight:  Fair  Psychomotor Activity:  Normal  Concentration:  Concentration: Good and Attention Span: Good  Recall:  Good  Fund of Knowledge:  Good  Language:  Good  Akathisia:  No  Handed:  Right  AIMS (if indicated):     Assets:  Financial Resources/Insurance Social Support  ADL's:  Intact  Cognition:  WNL  Sleep:  Number of Hours: 6.25   Treatment Plan Summary: Daily contact with patient to assess and evaluate symptoms and progress in treatment, Medication management and Plan is to:   Will continue today 11/03/16 plan as below except where  it is noted.  Depression: -Continue Celexa 20- mg PO Daily for mood stability.  Mood stabilization: -Continue Depakote 250 mg PO BID for mood stability. -Obtain Depakote level tomorrow 11-04-16.  Mood control -Continue Risperidone 4 mg PO QHS for mood stability. --Continue Risperidone Consta 37.5 mg IM Q14 days for mood stability  Insomnia: -Continue Trazodone 100 mg PO QHS PRN.  Anxiety: -Continue Hydroxyzine PRN for anxiety. -Continue Ativan PRN for severe agitation  -Encourage group therapy participation -SW staff to confirm if patient is on ACTT with [facility].  Sanjuana KavaNwoko, Agnes I, NP, PMHNP, FNP-BC 11/03/2016, 10:58 AM/Patient ID: [name], male   DOB: [birth year], 26 y.o.   MRN: [id number]

## 2016-11-03 NOTE — Tx Team (Signed)
Interdisciplinary Treatment and Diagnostic Plan Update  11/03/2016 Time of Session: 11:22 AM  [name] MRN: [id number]  Principal Diagnosis: Schizoaffective disorder, bipolar type ([place])  Secondary Diagnoses: Principal Problem:   Schizoaffective disorder, bipolar type ([place])   Current Medications:  Current Facility-Administered Medications  Medication Dose Route Frequency Provider Last Rate Last Dose  . acetaminophen (TYLENOL) tablet 650 mg  650 mg Oral Q6H PRN Okonkwo, Justina A, NP      . alum & mag hydroxide-simeth (MAALOX/MYLANTA) 200-200-20 MG/5ML suspension 30 mL  30 mL Oral Q4H PRN Okonkwo, Justina A, NP      . citalopram (CELEXA) tablet 20 mg  20 mg Oral Daily Okonkwo, Justina A, NP   20 mg at 11/03/16 0825  . divalproex (DEPAKOTE ER) 24 hr tablet 250 mg  250 mg Oral q12n4p Arfeen, Syed T, MD   250 mg at 11/01/16 1657  . hydrOXYzine (ATARAX/VISTARIL) tablet 25 mg  25 mg Oral TID PRN Hughie Closs A, NP   25 mg at 11/02/16 2207  . lisinopril (PRINIVIL,ZESTRIL) tablet 5 mg  5 mg Oral Daily Lindell Spar I, NP   5 mg at 11/03/16 0825  . LORazepam (ATIVAN) tablet 2 mg  2 mg Oral Once PRN Rozetta Nunnery, NP       Or  . LORazepam (ATIVAN) injection 2 mg  2 mg Intramuscular Once PRN Lindon Romp A, NP      . magnesium hydroxide (MILK OF MAGNESIA) suspension 30 mL  30 mL Oral Daily PRN Okonkwo, Justina A, NP      . risperiDONE (RISPERDAL) tablet 4 mg  4 mg Oral QHS Okonkwo, Justina A, NP   4 mg at 11/02/16 2207  . risperiDONE microspheres (RISPERDAL CONSTA) injection 37.5 mg  37.5 mg Intramuscular Q14 Days Okonkwo, Justina A, NP   37.5 mg at 10/31/16 1213  . traZODone (DESYREL) tablet 100 mg  100 mg Oral QHS Lindell Spar I, NP   100 mg at 11/02/16 2207    PTA Medications: Prescriptions Prior to Admission  Medication Sig Dispense Refill Last Dose  . citalopram (CELEXA) 20 MG tablet Take 1 tablet (20 mg total) by mouth daily. For depression and anxiety. 30 tablet 0   . lithium  carbonate (ESKALITH) 450 MG CR tablet Take 900 mg by mouth at bedtime.    03/15/2011  . mirtazapine (REMERON) 15 MG tablet Take 15 mg by mouth at bedtime.     03/15/2011  . paliperidone (INVEGA) 6 MG 24 hr tablet Take 6 mg by mouth every morning.     03/16/2011  . risperiDONE (RISPERDAL) 4 MG tablet Take 1 tablet (4 mg total) by mouth at bedtime. For psychosis and mood stabilization. 30 tablet 0   . risperiDONE microspheres (RISPERDAL CONSTA) 37.5 MG injection Inject 2 mLs (37.5 mg total) into the muscle every 14 (fourteen) days. For psychosis. 1 each    . traZODone (DESYREL) 100 MG tablet Take 100 mg by mouth at bedtime.     03/15/2011    Patient Stressors: Financial difficulties Medication change or noncompliance  Patient Strengths: Curator fund of knowledge Motivation for treatment/growth Physical Health Supportive family/friends  Treatment Modalities: Medication Management, Group therapy, Case management,  1 to 1 session with clinician, Psychoeducation, Recreational therapy.   Physician Treatment Plan for Primary Diagnosis: Schizoaffective disorder, bipolar type ([place]) Long Term Goal(s): Improvement in symptoms so as ready for discharge  Short Term Goals: Ability to identify changes in lifestyle to reduce recurrence of condition  will improve Ability to verbalize feelings will improve Ability to disclose and discuss suicidal ideas Ability to demonstrate self-control will improve Ability to demonstrate self-control will improve Ability to identify and develop effective coping behaviors will improve Ability to maintain clinical measurements within normal limits will improve Compliance with prescribed medications will improve Ability to identify triggers associated with substance abuse/mental health issues will improve  Medication Management: Evaluate patient's response, side effects, and tolerance of medication regimen.  Therapeutic Interventions: 1 to 1  sessions, Unit Group sessions and Medication administration.  Evaluation of Outcomes: Adequate for Discharge  Physician Treatment Plan for Secondary Diagnosis: Principal Problem:   Schizoaffective disorder, bipolar type ([place])   Long Term Goal(s): Improvement in symptoms so as ready for discharge  Short Term Goals: Ability to identify changes in lifestyle to reduce recurrence of condition will improve Ability to verbalize feelings will improve Ability to disclose and discuss suicidal ideas Ability to demonstrate self-control will improve Ability to demonstrate self-control will improve Ability to identify and develop effective coping behaviors will improve Ability to maintain clinical measurements within normal limits will improve Compliance with prescribed medications will improve Ability to identify triggers associated with substance abuse/mental health issues will improve  Medication Management: Evaluate patient's response, side effects, and tolerance of medication regimen.  Therapeutic Interventions: 1 to 1 sessions, Unit Group sessions and Medication administration.  Evaluation of Outcomes: Adequate for Discharge   RN Treatment Plan for Primary Diagnosis: Schizoaffective disorder, bipolar type ([place]) Long Term Goal(s): Knowledge of disease and therapeutic regimen to maintain health will improve  Short Term Goals: Ability to identify and develop effective coping behaviors will improve and Compliance with prescribed medications will improve  Medication Management: RN will administer medications as ordered by provider, will assess and evaluate patient's response and provide education to patient for prescribed medication. RN will report any adverse and/or side effects to prescribing provider.  Therapeutic Interventions: 1 on 1 counseling sessions, Psychoeducation, Medication administration, Evaluate responses to treatment, Monitor vital signs and CBGs as ordered, Perform/monitor  CIWA, COWS, AIMS and Fall Risk screenings as ordered, Perform wound care treatments as ordered.  Evaluation of Outcomes: Adequate for Discharge   LCSW Treatment Plan for Primary Diagnosis: Schizoaffective disorder, bipolar type ([place]) Long Term Goal(s): Safe transition to appropriate next level of care at discharge, Engage patient in therapeutic group addressing interpersonal concerns.  Short Term Goals: Engage patient in aftercare planning with referrals and resources  Therapeutic Interventions: Assess for all discharge needs, 1 to 1 time with Social worker, Explore available resources and support systems, Assess for adequacy in community support network, Educate family and significant other(s) on suicide prevention, Complete Psychosocial Assessment, Interpersonal group therapy.  Evaluation of Outcomes: Met  Return home, follow up [facility] team   Progress in Treatment: Attending groups: Yes Participating in groups: Yes Taking medication as prescribed: Yes Toleration medication: Yes, no side effects reported at this time Family/Significant other contact made: No Patient understands diagnosis: No Limited insight Discussing patient identified problems/goals with staff: Yes Medical problems stabilized or resolved: Yes Denies suicidal/homicidal ideation: Yes Issues/concerns per patient self-inventory: None Other: N/A  New problem(s) identified: None identified at this time.   New Short Term/Long Term Goal(s): None identified at this time.   Discharge Plan or Barriers:   Reason for Continuation of Hospitalization:  Medication stabilization  Estimated Length of Stay: Likely d/c tomorrow  Attendees: Patient: 11/03/2016  11:22 AM  Physician: Hampton Abbot, MD 11/03/2016  11:22 AM  Nursing: Sena Hitch,  RN 11/03/2016  11:22 AM  RN Care Manager: Lars Pinks, RN 11/03/2016  11:22 AM  Social Worker: Ripley Fraise 11/03/2016  11:22 AM  Recreational Therapist: Winfield Cunas 11/03/2016  11:22 AM  Other: Norberto Sorenson 11/03/2016  11:22 AM  Other:  11/03/2016  11:22 AM    Scribe for Treatment Team:  Roque Lias LCSW 11/03/2016 11:22 AM \

## 2016-11-03 NOTE — BHH Counselor (Signed)
Toya from ACT team reports that [name] [facility].  This has been stressful in that he used to sleep a lot, and now he shares a room with a 26 YO nephew who is high energy, and this has affected his routine.

## 2016-11-03 NOTE — Progress Notes (Signed)
D: Pt denies SI/HI/AVH. Pt is pleasant and cooperative. Pt stated he was doing better, pt appeared to be responding at times, but pt had appropriate behavior on the unit this evening.   A: Pt was offered support and encouragement. Pt was given scheduled medications. Pt was encourage to attend groups. Q 15 minute checks were done for safety.   R:Pt attends groups and interacts well with peers and staff. Pt is taking medication. Pt has no complaints.Pt receptive to treatment and safety maintained on unit.

## 2016-11-03 NOTE — Discharge Summary (Signed)
Physician Discharge Summary Note  Patient:  [name] is an 26 y.o., male MRN:  [id number]  DOB:  [birth year]  Patient phone:  [phone] (home)   Patient address:   [address] [place] [address],   Total Time spent with patient: Greater than 30 minutes  Date of Admission:  10/28/2016 Date of Discharge: 11-04-16  Reason for Admission: Auditory hallucinations & paranoia.  Principal Problem: Schizoaffective disorder, bipolar type [facility])  Discharge Diagnoses: Patient Active Problem List   Diagnosis Date Noted  . Schizoaffective disorder, bipolar type (HCC) [F25.0] 10/25/2011  . TEAR A C L [S83.509A] 01/21/2008  . SUBLUXATION PATELLAR (MALALIGNMENT) [M24.469] 12/17/2007  . PATELLAR DISLOCATION, LEFT [S83.006A] 12/17/2007   Past Psychiatric History: Schizoaffective disorder, Bipolar-type.  Past Medical History:  Past Medical History:  Diagnosis Date  . Bipolar 1 disorder [facility])     Past Surgical History:  Procedure Laterality Date  . KNEE ARTHROSCOPY    . KNEE SURGERY     Family History: History reviewed. No pertinent family history.  Family Psychiatric  History: See H&P  Social History:  History  Alcohol Use No     History  Drug Use No    Social History   Social History  . Marital status: Single    Spouse name: N/A  . Number of children: N/A  . Years of education: N/A   Social History Main Topics  . Smoking status: Current Every Day Smoker    Types: Cigarettes  . Smokeless tobacco: Never Used  . Alcohol use No  . Drug use: No  . Sexual activity: Not Asked   Other Topics Concern  . None   Social History Narrative   ** Merged History Encounter **       ** Merged History Encounter **       Hospital Course: patient is 26 year old African-American man who was admitted due to worsening of the symptom.  [name] is reporting auditory hallucination, paranoia, having weird thoughts.  2 weeks ago [name] went to see psychiatrist and given medication  and since then [name]'s noticed change in his behavior.  As per chart [name] was notice yelling and shouting in the bathroom and is talking to the people who were not there.  [name] also endorse having thoughts to killing people.  In the emergency room [name] appears very manic disoriented and about to have mental breakdown.  In the unit [name] remains very paranoid, tangential, delusional and complaining of poor sleep.  [name] lives with his mother and nephew but admitted having issues with them.  [name] is not sure what medicine was given by psychiatrist.  However [name] remember in the past [name] had a good response with Seroquel. Patient lithium level is 0.06) [name] noncompliant with medication.  Patient has history of previous suicidal attempt and inpatient psychiatric treatment in 2015.  [name] was discharged on Risperdal and lithium.   After evaluation of his presenting symptoms, [name] was started on the medication regimen targeting those symptoms. [name] was medicated & discharged on; Citalopram 20 mg for depression, Depakote ER 250 mg bid for mood stabilization, Hydroxyzine 25 mg prn for anxiety, Risperdal 4 mg daily for mood control, Risperdal injectable 37.5 mg IM Q 14 days for mood control, & Trazodone 100 mg for insomnia. [name] was enrolled in the group counseling sessions being offered & held on this unit. [name] learned coping skills. [name] received other medication regimen for the other medical issues presented. [name] tolerated his treatment regimen without  any adverse effects or reactions reported.   [name] is seen today by the attending psychiatrist for discharge. [name] has normal anxiety about going home. [name] is not overwhelmed by this. [name] is looking forward to continuing his medications to maintain mood stability. Not expressing any delusions today. No hallucination. Feels in control of himself. No passivity of thought. No passivity of will. No fantasy about suicide lately. No suicidal thoughts. No thoughts of violence. Does not feel depressed. No  evidence of mania.  Nursing staff reports that patient has been appropriate on the unit. Patient has been interacting well with peers. No behavioral issues. Patient has not voiced any suicidal thoughts. Patient has not been observed to be internally stimulated or preoccupied. Patient has been adherent with treatment recommendations. Patient has been tolerating his medication well. No reported adverse effects or reactions.   Patient was discussed at the treatment team meeting this morning. Team members feels that patient is back to his baseline level of function. Team agrees with plan to discharge patient today. [name] in no apparent distress. Transportation per mother.  Physical Findings: AIMS: Facial and Oral Movements Muscles of Facial Expression: None, normal Lips and Perioral Area: None, normal Jaw: None, normal Tongue: None, normal,Extremity Movements Upper (arms, wrists, hands, fingers): None, normal Lower (legs, knees, ankles, toes): None, normal, Trunk Movements Neck, shoulders, hips: None, normal, Overall Severity Severity of abnormal movements (highest score from questions above): None, normal Incapacitation due to abnormal movements: None, normal Patient's awareness of abnormal movements (rate only patient's report): No Awareness, Dental Status Current problems with teeth and/or dentures?: No Does patient usually wear dentures?: No  CIWA:    COWS:     Musculoskeletal: Strength & Muscle Tone: within normal limits Gait & Station: normal Patient leans: N/A  Psychiatric Specialty Exam: Physical Exam  Constitutional: [name] is oriented to person, place, and time. [name] appears well-developed.  HENT:  Head: Normocephalic.  Eyes: Pupils are equal, round, and reactive to light.  Neck: Normal range of motion.  Cardiovascular: Normal rate.   Respiratory: Effort normal.  GI: Soft.  Genitourinary:  Genitourinary Comments: Deferred  Musculoskeletal:  Normal range of motion.  Neurological: [name] is alert and oriented to person, place, and time.  Skin: Skin is warm.    Review of Systems  Constitutional: Negative.   HENT: Negative.   Eyes: Negative.   Respiratory: Negative.   Cardiovascular: Negative.   Gastrointestinal: Negative.   Genitourinary: Negative.   Musculoskeletal: Negative.   Skin: Negative.   Endo/Heme/Allergies: Negative.   Psychiatric/Behavioral: Positive for depression (Stable) and hallucinations (Hx. Psychosis). Negative for memory loss, substance abuse and suicidal ideas. The patient has insomnia (Stable). The patient is not nervous/anxious.     Blood pressure 131/80, pulse 85, temperature (!) 97.4 F (36.3 C), resp. rate 20, height 5\' 7"  (1.702 m), weight 100.7 kg (222 lb).Body mass index is 34.77 kg/m.  See Md's SRA   Have you used any form of tobacco in the last 30 days? (Cigarettes, Smokeless Tobacco, Cigars, and/or Pipes): Yes  Has this patient used any form of tobacco in the last 30 days? (Cigarettes, Smokeless Tobacco, Cigars, and/or Pipes): No  Blood Alcohol level:  Lab Results  Component Value Date   [facility] 10/27[facility]2016   ETH <11 10/24/2011   Metabolic Disorder Labs:  Lab Results  Component Value Date   HGBA1C  07/08/2009    5.7 (NOTE) The ADA recommends the following therapeutic goal for glycemic control related  to Hgb A1c measurement: Goal of therapy: <6.5 Hgb A1c  Reference: American Diabetes Association: Clinical Practice Recommendations 2010, Diabetes Care, 2010, 33: (Suppl  1).   MPG 117 07/08/2009   No results found for: PROLACTIN Lab Results  Component Value Date   CHOL  07/08/2009    125        ATP III CLASSIFICATION:  <200     mg/dL   Desirable  782-956  mg/dL   Borderline High  >=213    mg/dL   High          TRIG 61 07/08/2009   HDL 46 07/08/2009   CHOLHDL 2.7 07/08/2009   VLDL 12 07/08/2009   LDLCALC  07/08/2009    67        Total Cholesterol/HDL:CHD Risk Coronary Heart  Disease Risk Table                     Men   Women  1/2 Average Risk   3.4   3.3  Average Risk       5.0   4.4  2 X Average Risk   9.6   7.1  3 X Average Risk  23.4   11.0        Use the calculated Patient Ratio above and the CHD Risk Table to determine the patient's CHD Risk.        ATP III CLASSIFICATION (LDL):  <100     mg/dL   Optimal  086-578  mg/dL   Near or Above                    Optimal  130-159  mg/dL   Borderline  469-629  mg/dL   High  >528     mg/dL   Very High   See Psychiatric Specialty Exam and Suicide Risk Assessment completed by Attending Physician prior to discharge.  Discharge destination:  Home  Is patient on multiple antipsychotic therapies at discharge:  No   Has Patient had three or more failed trials of antipsychotic monotherapy by history:  No  Recommended Plan for Multiple Antipsychotic Therapies: NA   Allergies as of 11/03/2016   No Known Allergies     Medication List    STOP taking these medications   lithium carbonate 450 MG CR tablet Commonly known as:  ESKALITH   mirtazapine 15 MG tablet Commonly known as:  REMERON   paliperidone 6 MG 24 hr tablet Commonly known as:  INVEGA     TAKE these medications     Indication  citalopram 20 MG tablet Commonly known as:  CELEXA Take 1 tablet (20 mg total) by mouth daily. For depression What changed:  additional instructions  Indication:  Depression   divalproex 250 MG 24 hr tablet Commonly known as:  DEPAKOTE ER Take 1 tablet (250 mg total) by mouth 2 times daily at 12 noon and 4 pm. For mood stabilization  Indication:  Mood stability   hydrOXYzine 25 MG tablet Commonly known as:  ATARAX/VISTARIL Take 1 tablet (25 mg total) by mouth 3 (three) times daily as needed for anxiety.  Indication:  Feeling Anxious   lisinopril 5 MG tablet Commonly known as:  PRINIVIL,ZESTRIL Take 1 tablet (5 mg total) by mouth daily. For high blood pressure  Indication:  High Blood Pressure Disorder    risperidone 4 MG tablet Commonly known as:  RISPERDAL Take 1 tablet (4 mg total) by mouth at bedtime. For mood control What changed:  additional instructions  Indication:  Mood control   risperiDONE microspheres 37.5 MG injection Commonly known as:  RISPERDAL CONSTA Inject 2 mLs (37.5 mg total) into the muscle every 14 (fourteen) days. (Due on 11-14-16): For mood control What changed:  additional instructions  Indication:  Mood control   traZODone 100 MG tablet Commonly known as:  DESYREL Take 1 tablet (100 mg total) by mouth at bedtime. For sleep What changed:  additional instructions  Indication:  Trouble Sleeping      Follow-up Information    Services, [facility] Follow up.   Why:  ACT team will resume with you after d/c Contact information: [address] [place] [address] [phone]          Follow-up recommendations: Activity:  As tolerated Diet: As recommended by your primary care doctor. Keep all scheduled follow-up appointments as recommended.   Comments: Patient is instructed prior to discharge to: Take all medications as prescribed by his/her mental healthcare provider. Report any adverse effects and or reactions from the medicines to his/her outpatient provider promptly. Patient has been instructed & cautioned: To not engage in alcohol and or illegal drug use while on prescription medicines. In the event of worsening symptoms, patient is instructed to call the crisis hotline, 911 and or go to the nearest ED for appropriate evaluation and treatment of symptoms. To follow-up with his/her primary care provider for your other medical issues, concerns and or health care needs.   Signed: Sanjuana KavaNwoko, Nuel Dejaynes I, NP, PMHNP, FNP-BC 11/03/2016, 9:18 AM

## 2016-11-04 LAB — VALPROIC ACID LEVEL: Valproic Acid Lvl: 22 ug/mL — ABNORMAL LOW (ref 50.0–100.0)

## 2016-11-04 NOTE — Progress Notes (Signed)
Adult Psychoeducational Group Note  Date:  11/04/2016 Time:  4:01 AM  Group Topic/Focus:  Wrap-Up Group:   The focus of this group is to help patients review their daily goal of treatment and discuss progress on daily workbooks.  Participation Level:  Active  Participation Quality:  Appropriate  Affect:  Appropriate  Cognitive:  Appropriate  Insight: Appropriate  Engagement in Group:  Engaged  Modes of Intervention:  Discussion  Additional Comments:  Pt stated his goal for today was to stay positive and stay on the right track for discharge. Pt stated he talk with his doctor about discharge. Pt rated his overall day a 9.  Grant FurnaceChristopher  [name] 11/04/2016, 4:01 AM

## 2016-11-04 NOTE — Progress Notes (Signed)
Pt had denied SI, HI, and AVH this a.m. Patient was discharged per order. AVS, SRA, medications, scripts and transition summary were all reviewed with patient. Pt was given an opportunity to ask questions and verbalized understanding of all discharge paperwork. Belongings were returned, and patient signed for receipt. Patient verbalized readiness for discharge and appeared in no acute distress when escorted to lobby, where his mom waited to take him home.

## 2016-11-04 NOTE — Plan of Care (Signed)
Problem: [facility] Participation in Recreation Therapeutic Interventions Goal: STG-Patient will demonstrate improved communication skills b STG: Communication - Patient will improve communication skills, as demonstrated by ability to actively participate in at least 2 processing discussion during recreation therapy group sessions by conclusion of recreation therapy tx  Outcome: Completed/Met Date Met: 11/04/16 Pt demonstrated improved communication by actively communicating during discussions in groups.  Donovan Kail, Recreation Therapy Intern

## 2016-11-04 NOTE — Progress Notes (Signed)
Recreation Therapy Notes  INPATIENT RECREATION TR PLAN  Patient Details Name: [name] MRN: [id number] DOB: October 14, 1990 Today's Date: 11/04/2016  Rec Therapy Plan Is patient appropriate for Therapeutic Recreation?: Yes Treatment times per week: at least 3x Estimated Length of Stay: 5-7 days TR Treatment/Interventions: Group participation (Comment) (appropriate participation in recreation therapy groups)  Discharge Criteria Pt will be discharged from therapy if:: Discharged Treatment plan/goals/alternatives discussed and agreed upon by:: Patient/family  Discharge Summary Short term goals set: Patient will improve communication skills, as demonstrated by ability to actively participate in at least 2 processing discussion during recreation therapy group sessions by conclusion of recreation therapy tx  Short term goals met: Complete Progress toward goals comments: Groups attended Which groups?: Self-esteem, Communication, Leisure education (Problem-Solving, Teamwork) Reason goals not met: n/a Therapeutic equipment acquired: n/a Reason patient discharged from therapy: Discharge from hospital Pt/family agrees with progress & goals achieved: Yes Date patient discharged from therapy: 11/04/16  Donovan Kail, Recreation Therapy Intern  Donovan Kail 11/04/2016, 11:55 AM    Victorino Sparrow, LRT/CTRS

## 2016-11-04 NOTE — [facility] Suicide Risk Assessment (Signed)
[facility]   Principal Problem: Schizoaffective disorder, bipolar type [facility](HCC) Discharge Diagnoses:  Patient Active Problem List   Diagnosis Date Noted  . Schizoaffective disorder, bipolar type (HCC) [F25.0] 10/25/2011  . TEAR A C L [S83.509A] 01/21/2008  . SUBLUXATION PATELLAR (MALALIGNMENT) [M24.469] 12/17/2007  . PATELLAR DISLOCATION, LEFT [S83.006A] 12/17/2007   Patient is a 26 year old African-American male with a history of schizoaffective disorder, admitted due to having auditory hallucinations and increased agitation which has worsened over the past week. Mom reports that patient has been disoriented, yelling and shouting in the bathroom floor, talking to people who aren't there. She also reports that he is struggling with sleep, reports he sees a guy birthing a baby through goggles  Patient this morning, is doing well, states that he plans to take his medications as prescribed, will follow up with outpatient and adds that he is excited about going home. Patient denies any order treat of visual hallucinations, any paranoia, any thoughts of hurting himself or others. He also reports that he's eating fine and sleeping well. Discussed sleep hygiene, the need for medication compliance in length with patient Total Time spent with patient: 30 minutes  Musculoskeletal: Strength & Muscle Tone: within normal limits Gait & Station: normal Patient leans: N/A  Psychiatric Specialty Exam: Review of Systems  Constitutional: Negative.  Negative for fever and malaise/fatigue.  HENT: Negative.  Negative for congestion and sore throat.   Eyes: Negative.  Negative for blurred vision, double vision, discharge and redness.  Respiratory: Negative.  Negative for cough, shortness of breath and wheezing.   Cardiovascular: Negative.  Negative for chest pain and palpitations.  Gastrointestinal: Negative.  Negative for abdominal pain, constipation, diarrhea, heartburn, nausea and  vomiting.  Musculoskeletal: Negative for falls and myalgias.  Neurological: Negative.  Negative for dizziness, seizures, loss of consciousness, weakness and headaches.  Endo/Heme/Allergies: Negative.  Negative for environmental allergies.  Psychiatric/Behavioral: Negative.  Negative for depression, hallucinations, memory loss, substance abuse and suicidal ideas. The patient is not nervous/anxious and does not have insomnia.     Blood pressure 131/89, pulse (!) 113, temperature 98.1 F (36.7 C), temperature source Oral, resp. rate 20, height 5\' 7"  (1.702 m), weight 100.7 kg (222 lb).Body mass index is 34.77 kg/m.  General Appearance: Casual  Eye Contact::  Fair  Speech:  Clear and Coherent and Normal Rate409  Volume:  Normal  Mood:  Euthymic  Affect:  Congruent and Full Range  Thought Process:  Coherent, Goal Directed and Descriptions of Associations: Intact  Orientation:  Full (Time, Place, and Person)  Thought Content:  WDL  Suicidal Thoughts:  No  Homicidal Thoughts:  No  Memory:  Immediate;   Fair Recent;   Fair Remote;   Fair  Judgement:  Intact  Insight:  Present  Psychomotor Activity:  Normal  Concentration:  Fair  Recall:  FiservFair  Fund of Knowledge:Fair  Language: Fair  Akathisia:  No  Handed:  Right  AIMS (if indicated):     Assets:  Communication Skills Desire for Improvement Housing Physical Health Social Support  Sleep:  Number of Hours: 6.75  Cognition: WNL  ADL's:  Intact   Mental Status Per Nursing Assessment::   On Admission:  NA  Demographic Factors:  Male, Adolescent or young adult and Unemployed  Loss Factors: NA  Historical Factors: NA  Risk Reduction Factors:   Sense of responsibility to family, Religious beliefs about death and Living with another person, especially a relative  Continued Clinical Symptoms:  Previous Psychiatric Diagnoses and Treatments  Cognitive Features That Contribute To Risk:  None    Suicide Risk:  Minimal: No  identifiable suicidal ideation.  Patients presenting with no risk factors but with morbid ruminations; may be classified as minimal risk based on the severity of the depressive symptoms  Follow-up Information    Services, [facility] Follow up.   Why:  If the ACT team does not pick you up the day of d/c, they will see you the next business day  Contact information: [address] [place] [address] [phone]           Plan Of Care/Follow-up recommendations:  Activity:  As tolerated Diet:  Regular Other:  Keep follow-up appointments and take medications as prescribed.  Nelly RoutKUMAR,Tashyra Adduci, MD 11/04/2016, 11:15 AM

## 2016-11-04 NOTE — Progress Notes (Signed)
D: Patient seen watching TV on dayroom. Minimal interaction. Denies pain, SI/HI, AH/VH at this time. Verbalizes no concern. Accepted this bedtime med. No behavior issues noted.  A: Staff offered support and encouragement as needed. Routine safety checks maintained. Will  continue to monitor patient.  R: Patient remians safe on unit.

## 2016-11-04 NOTE — Progress Notes (Signed)
Recreation Therapy Notes  Date: 11/04/2016 Time: 10:00am Location: [address] Dayroom  Group Topic: Teamwork and Communication  Goal Area(s) Addresses:  Pt will successfully communicate with team members in order to reach a shared goal.  Behavioral Response: Engaged  Intervention: Straws, Masking Tape, Golf Ball  Activity: Pts were split into three teams and each team was given 12 straws and 18 inches of masking tape. Pts were instructed to create a free standing landing pad for a golf ball.  Education: Film/video editorCommunication, Teamwork, Discharge Planning  Education Outcome: Acknowledges understanding  Clinical Observations/Feedback: Pt actively participated in activity. Pt identified that in order to be successful, he had to trust his team member's ideas.  Marvell Fullerachel Meyer, Recreation Therapy Intern  Caroll RancherMarjette Ramyah Pankowski, LRT/CTRS

## 2016-11-22 DIAGNOSIS — Z0000 Encounter for general adult medical examination without abnormal findings: Secondary | ICD-10-CM | POA: Diagnosis not present

## 2017-02-21 ENCOUNTER — Encounter (HOSPITAL_COMMUNITY): Payer: Self-pay | Admitting: *Deleted

## 2017-02-21 ENCOUNTER — Emergency Department (HOSPITAL_COMMUNITY)
Admission: EM | Admit: 2017-02-21 | Discharge: 2017-02-21 | Disposition: A | Discharge status: Home / Self Care | Payer: Medicare Other | Attending: Emergency Medicine | Admitting: Emergency Medicine

## 2017-02-21 ENCOUNTER — Other Ambulatory Visit: Payer: Self-pay

## 2017-02-21 DIAGNOSIS — X500XXA Overexertion from strenuous movement or load, initial encounter: Secondary | ICD-10-CM | POA: Diagnosis not present

## 2017-02-21 DIAGNOSIS — S46819A Strain of other muscles, fascia and tendons at shoulder and upper arm level, unspecified arm, initial encounter: Secondary | ICD-10-CM

## 2017-02-21 DIAGNOSIS — Z79899 Other long term (current) drug therapy: Secondary | ICD-10-CM | POA: Diagnosis not present

## 2017-02-21 DIAGNOSIS — Y999 Unspecified external cause status: Secondary | ICD-10-CM | POA: Insufficient documentation

## 2017-02-21 DIAGNOSIS — Y9389 Activity, other specified: Secondary | ICD-10-CM | POA: Diagnosis not present

## 2017-02-21 DIAGNOSIS — F17210 Nicotine dependence, cigarettes, uncomplicated: Secondary | ICD-10-CM | POA: Diagnosis not present

## 2017-02-21 DIAGNOSIS — S39012A Strain of muscle, fascia and tendon of lower back, initial encounter: Secondary | ICD-10-CM | POA: Insufficient documentation

## 2017-02-21 DIAGNOSIS — Y929 Unspecified place or not applicable: Secondary | ICD-10-CM | POA: Diagnosis not present

## 2017-02-21 DIAGNOSIS — S46811A Strain of other muscles, fascia and tendons at shoulder and upper arm level, right arm, initial encounter: Secondary | ICD-10-CM | POA: Diagnosis not present

## 2017-02-21 DIAGNOSIS — S3992XA Unspecified injury of lower back, initial encounter: Secondary | ICD-10-CM | POA: Diagnosis present

## 2017-02-21 HISTORY — DX: Depression, unspecified: F32.A

## 2017-02-21 HISTORY — DX: Major depressive disorder, single episode, unspecified: F32.9

## 2017-02-21 MED ORDER — IBUPROFEN 600 MG PO TABS: 600.0000 mg | ORAL_TABLET | Freq: Four times a day (QID) | ORAL | 0 refills | Status: AC

## 2017-02-21 MED ORDER — CYCLOBENZAPRINE HCL 10 MG PO TABS: 10.0000 mg | ORAL_TABLET | Freq: Three times a day (TID) | ORAL | 0 refills | Status: AC

## 2017-02-21 NOTE — ED Notes (Signed)
C/o neck and back pain rating pain 10/10.  Pt says he was moving furniture about 2 days ago.

## 2017-02-21 NOTE — Discharge Instructions (Signed)
Your examination suggest muscle strain involving the muscles around your shoulder and neck area, as well as your lower back.  Please use warm Epson salt tub soaks for 15-20 minutes daily.  Please use 600 mg of ibuprofen with breakfast, lunch, dinner, and at bedtime.  Please use Flexeril 3 times daily. This medication may cause drowsiness. Please do not drink, drive, or participate in activity that requires concentration while taking this medication.

## 2017-02-21 NOTE — ED Provider Notes (Signed)
[facility] Provider [facility]   CSN: [id number] Arrival date & time: 02/21/17  1632     History   Chief Complaint Chief Complaint  Patient presents with  . Back Pain    HPI [name] is a 26 y.o. male.  The history is provided by the patient.  Back Pain   This is a new problem. The current episode started more than 2 days ago. The problem occurs daily. The problem has been gradually worsening. The pain is associated with lifting heavy objects. The pain is present in the lumbar spine. The quality of the pain is described as aching. The pain is at a severity of 10/10. The pain is the same all the time. Pertinent negatives include no chest pain, no fever, no abdominal pain, no bowel incontinence, no perianal numbness, no bladder incontinence and no dysuria. He has tried nothing for the symptoms.    Past Medical History:  Diagnosis Date  . Bipolar 1 disorder (HCC)   . Depression     Patient Active Problem List   Diagnosis Date Noted  . Schizoaffective disorder, bipolar type (HCC) 10/25/2011  . TEAR A C L 01/21/2008  . SUBLUXATION PATELLAR (MALALIGNMENT) 12/17/2007  . PATELLAR DISLOCATION, LEFT 12/17/2007    Past Surgical History:  Procedure Laterality Date  . KNEE ARTHROSCOPY    . KNEE SURGERY         Home Medications    Prior to Admission medications   Medication Sig Start Date End Date Taking? Authorizing Provider  citalopram (CELEXA) 20 MG tablet Take 1 tablet (20 mg total) by mouth daily. For depression 11/04/16   Armandina StammerNwoko, Agnes I, NP  divalproex (DEPAKOTE ER) 250 MG 24 hr tablet Take 1 tablet (250 mg total) by mouth 2 times daily at 12 noon and 4 pm. For mood stabilization 11/03/16   Armandina StammerNwoko, Agnes I, NP  hydrOXYzine (ATARAX/VISTARIL) 25 MG tablet Take 1 tablet (25 mg total) by mouth 3 (three) times daily as needed for anxiety. 11/03/16   Armandina StammerNwoko, Agnes I, NP  lisinopril (PRINIVIL,ZESTRIL) 5 MG tablet Take 1 tablet (5 mg total) by mouth daily. For high  blood pressure 11/04/16   Nwoko, Nicole KindredAgnes I, NP  risperiDONE (RISPERDAL) 4 MG tablet Take 1 tablet (4 mg total) by mouth at bedtime. For mood control 11/03/16   Armandina StammerNwoko, Agnes I, NP  risperiDONE microspheres (RISPERDAL CONSTA) 37.5 MG injection Inject 2 mLs (37.5 mg total) into the muscle every 14 (fourteen) days. (Due on 11-14-16): For mood control 11/14/16   Armandina StammerNwoko, Agnes I, NP  traZODone (DESYREL) 100 MG tablet Take 1 tablet (100 mg total) by mouth at bedtime. For sleep 11/03/16   Armandina StammerNwoko, Agnes I, NP    Family History No family history on file.  Social History Social History   Tobacco Use  . Smoking status: Current Every Day Smoker    Types: Cigarettes  . Smokeless tobacco: Never Used  Substance Use Topics  . Alcohol use: No  . Drug use: No     Allergies   Patient has no known allergies.   Review of Systems Review of Systems  Constitutional: Negative for activity change and fever.       All ROS Neg except as noted in HPI  HENT: Negative for nosebleeds.   Eyes: Negative for photophobia and discharge.  Respiratory: Negative for cough, shortness of breath and wheezing.   Cardiovascular: Negative for chest pain and palpitations.  Gastrointestinal: Negative for abdominal pain, blood in stool and  bowel incontinence.  Genitourinary: Negative for bladder incontinence, dysuria, frequency and hematuria.  Musculoskeletal: Positive for back pain. Negative for arthralgias and neck pain.  Skin: Negative.   Neurological: Negative for dizziness, seizures and speech difficulty.  Psychiatric/Behavioral: Negative for confusion and hallucinations.     Physical Exam Updated Vital Signs BP (!) 144/93 (BP Location: Right Arm)   Pulse (!) 101   Temp 97.8 F (36.6 C) (Oral)   Resp 20   Ht 5\' 6"  (1.676 m)   Wt 112.5 kg (248 lb)   SpO2 99%   BMI 40.03 kg/m   Physical Exam  Constitutional: He is oriented to person, place, and time. He appears well-developed and well-nourished.  Non-toxic  appearance.  HENT:  Head: Normocephalic.  Right Ear: Tympanic membrane and external ear normal.  Left Ear: Tympanic membrane and external ear normal.  Eyes: EOM and lids are normal. Pupils are equal, round, and reactive to light.  Neck: Normal range of motion. Neck supple. Carotid bruit is not present.  Cardiovascular: Normal rate, regular rhythm, normal heart sounds, intact distal pulses and normal pulses.  Pulmonary/Chest: Breath sounds normal. No respiratory distress.  Abdominal: Soft. Bowel sounds are normal. There is no tenderness. There is no guarding.  Musculoskeletal: He exhibits tenderness.       Cervical back: He exhibits spasm.       Lumbar back: He exhibits pain and spasm.       Back:  No hot areas appreciated of the cervical, thoracic, or lumbar spine.  No palpable step-off appreciated.  Muscle spasm appreciated of the upper trapezius.  Also of the lower lumbar paraspinal area.  Lymphadenopathy:       Head (right side): No submandibular adenopathy present.       Head (left side): No submandibular adenopathy present.    He has no cervical adenopathy.  Neurological: He is alert and oriented to person, place, and time. He has normal strength. No cranial nerve deficit or sensory deficit.  Skin: Skin is warm and dry.  Psychiatric: He has a normal mood and affect. His speech is normal.  Nursing note and vitals reviewed.    ED Treatments / Results  Labs (all labs ordered are listed, but only abnormal results are displayed) Labs Reviewed - No data to display  EKG  EKG Interpretation None       Radiology No results found.  Procedures Procedures (including critical care time)  Medications Ordered in ED Medications - No data to display   Initial Impression / Assessment and Plan / ED Course  I have reviewed the triage vital signs and the nursing notes.  Pertinent labs & imaging results that were available during my care of the patient were reviewed by me and  considered in my medical decision making (see chart for details).       Final Clinical Impressions(s) / ED Diagnoses MDM Vital signs reviewed.  Examination suggests muscle strain involving the trapezius as well as the lumbar paraspinal areas.  No acute changes noted.  No evidence for cauda equina.  Patient is ambulatory without problem.  Patient will be treated with Flexeril, warm tub soaks, and ibuprofen 4 times daily.  The patient will follow up with Dr. Kerin SalenHill-primary care physician.   Final diagnoses:  Strain of lumbar region, initial encounter  Strain of trapezius muscle, unspecified laterality, initial encounter    ED Discharge Orders    None       Ivery QualeBryant, Wylder Macomber, PA-C 02/21/17 1854    Long,  Arlyss Repress, MD 02/22/17 1016

## 2017-02-21 NOTE — ED Triage Notes (Signed)
Pt c/o neck, lower back and bilateral knee pain x months. Denies injury.

## 2017-02-27 DIAGNOSIS — M545 Low back pain: Secondary | ICD-10-CM | POA: Diagnosis not present

## 2017-02-27 DIAGNOSIS — B353 Tinea pedis: Secondary | ICD-10-CM | POA: Diagnosis not present

## 2017-04-11 DIAGNOSIS — B353 Tinea pedis: Secondary | ICD-10-CM | POA: Diagnosis not present

## 2017-05-15 DIAGNOSIS — B353 Tinea pedis: Secondary | ICD-10-CM | POA: Diagnosis not present

## 2017-06-12 DIAGNOSIS — F250 Schizoaffective disorder, bipolar type: Secondary | ICD-10-CM | POA: Diagnosis not present

## 2017-07-17 DIAGNOSIS — B353 Tinea pedis: Secondary | ICD-10-CM | POA: Diagnosis not present

## 2017-08-25 DIAGNOSIS — F250 Schizoaffective disorder, bipolar type: Secondary | ICD-10-CM | POA: Diagnosis not present

## 2017-08-30 DIAGNOSIS — B353 Tinea pedis: Secondary | ICD-10-CM | POA: Diagnosis not present

## 2017-10-31 DIAGNOSIS — B353 Tinea pedis: Secondary | ICD-10-CM | POA: Diagnosis not present

## 2017-10-31 DIAGNOSIS — R000 Tachycardia, unspecified: Secondary | ICD-10-CM | POA: Diagnosis not present

## 2017-10-31 DIAGNOSIS — Z6835 Body mass index (BMI) 35.0-35.9, adult: Secondary | ICD-10-CM | POA: Diagnosis not present

## 2017-11-20 DIAGNOSIS — B353 Tinea pedis: Secondary | ICD-10-CM | POA: Diagnosis not present

## 2017-11-20 DIAGNOSIS — Z0283 Encounter for blood-alcohol and blood-drug test: Secondary | ICD-10-CM | POA: Diagnosis not present

## 2017-11-20 DIAGNOSIS — Z6835 Body mass index (BMI) 35.0-35.9, adult: Secondary | ICD-10-CM | POA: Diagnosis not present

## 2017-11-20 DIAGNOSIS — R000 Tachycardia, unspecified: Secondary | ICD-10-CM | POA: Diagnosis not present

## 2017-11-20 DIAGNOSIS — R945 Abnormal results of liver function studies: Secondary | ICD-10-CM | POA: Diagnosis not present

## 2018-05-21 DIAGNOSIS — F250 Schizoaffective disorder, bipolar type: Secondary | ICD-10-CM | POA: Diagnosis not present

## 2018-06-29 DIAGNOSIS — Z79899 Other long term (current) drug therapy: Secondary | ICD-10-CM | POA: Diagnosis not present

## 2018-07-09 DIAGNOSIS — F209 Schizophrenia, unspecified: Secondary | ICD-10-CM | POA: Diagnosis not present

## 2018-08-06 DIAGNOSIS — F209 Schizophrenia, unspecified: Secondary | ICD-10-CM | POA: Diagnosis not present

## 2018-09-03 DIAGNOSIS — F209 Schizophrenia, unspecified: Secondary | ICD-10-CM | POA: Diagnosis not present

## 2018-10-08 DIAGNOSIS — F209 Schizophrenia, unspecified: Secondary | ICD-10-CM | POA: Diagnosis not present

## 2018-10-08 DIAGNOSIS — L602 Onychogryphosis: Secondary | ICD-10-CM | POA: Diagnosis not present

## 2018-10-08 DIAGNOSIS — Z79899 Other long term (current) drug therapy: Secondary | ICD-10-CM | POA: Diagnosis not present

## 2018-10-08 DIAGNOSIS — E669 Obesity, unspecified: Secondary | ICD-10-CM | POA: Diagnosis not present

## 2018-11-05 DIAGNOSIS — F209 Schizophrenia, unspecified: Secondary | ICD-10-CM | POA: Diagnosis not present

## 2018-11-05 DIAGNOSIS — I10 Essential (primary) hypertension: Secondary | ICD-10-CM | POA: Diagnosis not present

## 2018-11-05 DIAGNOSIS — Z79899 Other long term (current) drug therapy: Secondary | ICD-10-CM | POA: Diagnosis not present

## 2018-11-05 DIAGNOSIS — E669 Obesity, unspecified: Secondary | ICD-10-CM | POA: Diagnosis not present

## 2018-12-11 DIAGNOSIS — F17210 Nicotine dependence, cigarettes, uncomplicated: Secondary | ICD-10-CM | POA: Diagnosis not present

## 2018-12-11 DIAGNOSIS — L602 Onychogryphosis: Secondary | ICD-10-CM | POA: Diagnosis not present

## 2018-12-11 DIAGNOSIS — I10 Essential (primary) hypertension: Secondary | ICD-10-CM | POA: Diagnosis not present

## 2018-12-11 DIAGNOSIS — F209 Schizophrenia, unspecified: Secondary | ICD-10-CM | POA: Diagnosis not present

## 2019-01-07 DIAGNOSIS — F209 Schizophrenia, unspecified: Secondary | ICD-10-CM | POA: Diagnosis not present

## 2019-01-07 DIAGNOSIS — E669 Obesity, unspecified: Secondary | ICD-10-CM | POA: Diagnosis not present

## 2019-01-07 DIAGNOSIS — I10 Essential (primary) hypertension: Secondary | ICD-10-CM | POA: Diagnosis not present

## 2019-01-07 DIAGNOSIS — L602 Onychogryphosis: Secondary | ICD-10-CM | POA: Diagnosis not present

## 2019-02-04 DIAGNOSIS — Z79899 Other long term (current) drug therapy: Secondary | ICD-10-CM | POA: Diagnosis not present

## 2019-02-04 DIAGNOSIS — E669 Obesity, unspecified: Secondary | ICD-10-CM | POA: Diagnosis not present

## 2019-02-04 DIAGNOSIS — F17210 Nicotine dependence, cigarettes, uncomplicated: Secondary | ICD-10-CM | POA: Diagnosis not present

## 2019-02-04 DIAGNOSIS — I10 Essential (primary) hypertension: Secondary | ICD-10-CM | POA: Diagnosis not present

## 2019-02-04 DIAGNOSIS — F209 Schizophrenia, unspecified: Secondary | ICD-10-CM | POA: Diagnosis not present

## 2019-03-08 DIAGNOSIS — I10 Essential (primary) hypertension: Secondary | ICD-10-CM | POA: Diagnosis not present

## 2019-03-08 DIAGNOSIS — Z79899 Other long term (current) drug therapy: Secondary | ICD-10-CM | POA: Diagnosis not present

## 2019-05-06 DIAGNOSIS — F209 Schizophrenia, unspecified: Secondary | ICD-10-CM | POA: Diagnosis not present

## 2019-05-06 DIAGNOSIS — E669 Obesity, unspecified: Secondary | ICD-10-CM | POA: Diagnosis not present

## 2019-05-06 DIAGNOSIS — I10 Essential (primary) hypertension: Secondary | ICD-10-CM | POA: Diagnosis not present

## 2019-06-03 DIAGNOSIS — I10 Essential (primary) hypertension: Secondary | ICD-10-CM | POA: Diagnosis not present

## 2019-06-03 DIAGNOSIS — E669 Obesity, unspecified: Secondary | ICD-10-CM | POA: Diagnosis not present

## 2019-06-03 DIAGNOSIS — F209 Schizophrenia, unspecified: Secondary | ICD-10-CM | POA: Diagnosis not present

## 2019-07-11 DIAGNOSIS — F209 Schizophrenia, unspecified: Secondary | ICD-10-CM | POA: Diagnosis not present

## 2019-07-11 DIAGNOSIS — I10 Essential (primary) hypertension: Secondary | ICD-10-CM | POA: Diagnosis not present

## 2019-07-11 DIAGNOSIS — E669 Obesity, unspecified: Secondary | ICD-10-CM | POA: Diagnosis not present

## 2019-07-12 DIAGNOSIS — F209 Schizophrenia, unspecified: Secondary | ICD-10-CM | POA: Diagnosis not present

## 2019-07-12 DIAGNOSIS — I10 Essential (primary) hypertension: Secondary | ICD-10-CM | POA: Diagnosis not present

## 2019-07-12 DIAGNOSIS — Z79899 Other long term (current) drug therapy: Secondary | ICD-10-CM | POA: Diagnosis not present

## 2019-07-24 DIAGNOSIS — Z79899 Other long term (current) drug therapy: Secondary | ICD-10-CM | POA: Diagnosis not present

## 2019-08-05 DIAGNOSIS — E669 Obesity, unspecified: Secondary | ICD-10-CM | POA: Diagnosis not present

## 2019-08-05 DIAGNOSIS — I10 Essential (primary) hypertension: Secondary | ICD-10-CM | POA: Diagnosis not present

## 2019-08-05 DIAGNOSIS — F209 Schizophrenia, unspecified: Secondary | ICD-10-CM | POA: Diagnosis not present

## 2019-09-10 DIAGNOSIS — F17210 Nicotine dependence, cigarettes, uncomplicated: Secondary | ICD-10-CM | POA: Diagnosis not present

## 2019-09-10 DIAGNOSIS — E669 Obesity, unspecified: Secondary | ICD-10-CM | POA: Diagnosis not present

## 2019-09-10 DIAGNOSIS — I10 Essential (primary) hypertension: Secondary | ICD-10-CM | POA: Diagnosis not present

## 2019-09-13 DIAGNOSIS — F209 Schizophrenia, unspecified: Secondary | ICD-10-CM | POA: Diagnosis not present

## 2019-09-18 DIAGNOSIS — Z23 Encounter for immunization: Secondary | ICD-10-CM | POA: Diagnosis not present

## 2019-10-08 DIAGNOSIS — E669 Obesity, unspecified: Secondary | ICD-10-CM | POA: Diagnosis not present

## 2019-10-08 DIAGNOSIS — I10 Essential (primary) hypertension: Secondary | ICD-10-CM | POA: Diagnosis not present

## 2019-10-08 DIAGNOSIS — F17210 Nicotine dependence, cigarettes, uncomplicated: Secondary | ICD-10-CM | POA: Diagnosis not present

## 2019-11-19 DIAGNOSIS — I10 Essential (primary) hypertension: Secondary | ICD-10-CM | POA: Diagnosis not present

## 2019-11-19 DIAGNOSIS — F209 Schizophrenia, unspecified: Secondary | ICD-10-CM | POA: Diagnosis not present

## 2019-11-19 DIAGNOSIS — F17210 Nicotine dependence, cigarettes, uncomplicated: Secondary | ICD-10-CM | POA: Diagnosis not present

## 2019-12-17 DIAGNOSIS — I10 Essential (primary) hypertension: Secondary | ICD-10-CM | POA: Diagnosis not present

## 2019-12-17 DIAGNOSIS — E669 Obesity, unspecified: Secondary | ICD-10-CM | POA: Diagnosis not present

## 2020-01-07 DIAGNOSIS — F17210 Nicotine dependence, cigarettes, uncomplicated: Secondary | ICD-10-CM | POA: Diagnosis not present

## 2020-01-07 DIAGNOSIS — I10 Essential (primary) hypertension: Secondary | ICD-10-CM | POA: Diagnosis not present
